# Patient Record
Sex: Male | Born: 1995 | Race: White | Hispanic: No | Marital: Single | Smoking: Never smoker
Health system: Southern US, Community
[De-identification: ages and names within clinical notes are randomized; demographics above are authoritative.]

## PROBLEM LIST (undated history)

## (undated) DIAGNOSIS — F419 Anxiety disorder, unspecified: Secondary | ICD-10-CM

## (undated) DIAGNOSIS — K219 Gastro-esophageal reflux disease without esophagitis: Secondary | ICD-10-CM

## (undated) DIAGNOSIS — F32A Depression, unspecified: Secondary | ICD-10-CM

## (undated) HISTORY — DX: Anxiety disorder, unspecified: F41.9

## (undated) HISTORY — PX: OTHER SURGICAL HISTORY: SHX169

## (undated) HISTORY — DX: Gastro-esophageal reflux disease without esophagitis: K21.9

## (undated) HISTORY — DX: Depression, unspecified: F32.A

---

## 2015-06-06 ENCOUNTER — Ambulatory Visit (INDEPENDENT_AMBULATORY_CARE_PROVIDER_SITE_OTHER): Payer: BLUE CROSS/BLUE SHIELD | Admitting: Gastroenterology

## 2015-06-06 ENCOUNTER — Other Ambulatory Visit: Payer: Self-pay

## 2015-06-06 ENCOUNTER — Encounter: Payer: Self-pay | Admitting: Gastroenterology

## 2015-06-06 VITALS — BP 122/61 | HR 68 | Temp 98.4°F | Ht 72.0 in | Wt 151.0 lb

## 2015-06-06 DIAGNOSIS — R12 Heartburn: Secondary | ICD-10-CM | POA: Diagnosis not present

## 2015-06-06 DIAGNOSIS — K30 Functional dyspepsia: Secondary | ICD-10-CM

## 2015-06-06 DIAGNOSIS — R1013 Epigastric pain: Secondary | ICD-10-CM

## 2015-06-06 DIAGNOSIS — K219 Gastro-esophageal reflux disease without esophagitis: Secondary | ICD-10-CM

## 2015-06-06 NOTE — Assessment & Plan Note (Signed)
This patient is 19 year old gentleman who comes in today with his parents for heartburn and abdominal pain and bloating. The patient reports that his burning sensation goes from his chest down to his lower abdomen. There is no report of any unexplained weight loss. The patient also has a history of reflux as a small child around the age of 19. The patient has not been feeling better with Prilosec or with vinegar that his mother said helped him when he was 10. The patient will be started on trial of Dexilant. If he has resolution of his symptoms he may try to go back on his omeprazole to take it at night before he goes to sleep since most of his symptoms are in the middle of the night. I do not think that the patient needs an upper endoscopy this time since he has no worry symptoms. The patient and his family have been explained the plan and agree with it.

## 2015-06-06 NOTE — Progress Notes (Signed)
Gastroenterology Consultation  Referring Provider:     No ref. provider found Primary Care Physician:  Cole PerchesMegan Johnson, DO Primary Gastroenterologist:  Dr. Servando SnareWohl     Reason for Consultation:     Abdominal bloating with heartburn        HPI:   Cole Riley is a 19 y.o. y/o male referred for consultation & management of  heartburn by Dr. Olevia PerchesMegan Johnson, DO.   The patient comes with his mother a symptoms of burning in his chest and into his a both at the top of his abdomen and the lower part of his abdomen. He reports that the symptoms are worse when he does not eat and better when he does the eat. The patient was on omeprazole in that he was still having problems with waking up at night with heartburn and nausea. The patient's mother reports that the patient had severe reflux when he was 19 years old. At that time she treated him with vinegar and she reports his symptoms went away. She tried this time but states that his symptoms did not get any better.The patient denies any unexplained weight loss. The patient denies any change in his bowel habits. He also denies any vomiting up of blood. There is no report of any black stools or bloody stools.  Past Medical History  Diagnosis Date  . GERD (gastroesophageal reflux disease)     Past Surgical History  Procedure Laterality Date  . Fenulectomy      Prior to Admission medications   Medication Sig Start Date End Date Taking? Authorizing Provider  omeprazole (PRILOSEC) 40 MG capsule Take 40 mg by mouth daily.  04/03/15   Megan Holly BodilyP Johnson, DO    Family History  Problem Relation Age of Onset  . Diabetes Maternal Grandmother   . Hypertension Maternal Grandmother   . Heart disease Paternal Grandmother   . Colon cancer Paternal Grandfather      History  Substance Use Topics  . Smoking status: Never Smoker   . Smokeless tobacco: Not on file  . Alcohol Use: No    Allergies as of 06/06/2015  . (No Known Allergies)    Review of Systems:     All systems reviewed and negative except where noted in HPI.   Physical Exam:  BP 122/61 mmHg  Pulse 68  Temp(Src) 98.4 F (36.9 C) (Oral)  Ht 6' (1.829 m)  Wt 151 lb (68.493 kg)  BMI 20.47 kg/m2 No LMP for male patient. Psych:  Alert and cooperative. Normal mood and affect. General:   Alert,  Well-developed, well-nourished, pleasant and cooperative in NAD Head:  Normocephalic and atraumatic. Eyes:  Sclera clear, no icterus.   Conjunctiva pink. Ears:  Normal auditory acuity. Nose:  No deformity, discharge, or lesions. Mouth:  No deformity or lesions,oropharynx pink & moist. Neck:  Supple; no masses or thyromegaly. Lungs:  Respirations even and unlabored.  Clear throughout to auscultation.   No wheezes, crackles, or rhonchi. No acute distress. Heart:  Regular rate and rhythm; no murmurs, clicks, rubs, or gallops. Abdomen:  Normal bowel sounds.  No bruits.  Soft, non-tender and non-distended without masses, hepatosplenomegaly or hernias noted.  No guarding or rebound tenderness.  Negative Carnett sign.   Rectal:  Deferred.  Msk:  Symmetrical without gross deformities.  Good, equal movement & strength bilaterally. Pulses:  Normal pulses noted. Extremities:  No clubbing or edema.  No cyanosis. Neurologic:  Alert and oriented x3;  grossly normal neurologically. Skin:  Intact  without significant lesions or rashes.  No jaundice. Lymph Nodes:  No significant cervical adenopathy. Psych:  Alert and cooperative. Normal mood and affect.  Imaging Studies: No results found.

## 2015-06-28 ENCOUNTER — Other Ambulatory Visit: Payer: Self-pay

## 2015-06-28 DIAGNOSIS — K219 Gastro-esophageal reflux disease without esophagitis: Secondary | ICD-10-CM

## 2015-06-28 MED ORDER — DEXLANSOPRAZOLE 60 MG PO CPDR: 60.0000 mg | DELAYED_RELEASE_CAPSULE | Freq: Every day | ORAL | Status: DC

## 2015-07-14 ENCOUNTER — Ambulatory Visit (INDEPENDENT_AMBULATORY_CARE_PROVIDER_SITE_OTHER): Payer: BLUE CROSS/BLUE SHIELD | Admitting: Family Medicine

## 2015-07-14 ENCOUNTER — Encounter: Payer: Self-pay | Admitting: Family Medicine

## 2015-07-14 VITALS — BP 125/74 | HR 72 | Temp 98.3°F | Ht 71.0 in | Wt 148.9 lb

## 2015-07-14 DIAGNOSIS — Z0000 Encounter for general adult medical examination without abnormal findings: Secondary | ICD-10-CM | POA: Diagnosis not present

## 2015-07-14 DIAGNOSIS — K219 Gastro-esophageal reflux disease without esophagitis: Secondary | ICD-10-CM | POA: Diagnosis not present

## 2015-07-14 DIAGNOSIS — Z23 Encounter for immunization: Secondary | ICD-10-CM | POA: Diagnosis not present

## 2015-07-14 LAB — LIPID PANEL PICCOLO, WAIVED
CHOL/HDL RATIO PICCOLO,WAIVE: 2.7 mg/dL
Cholesterol Piccolo, Waived: 120 mg/dL (ref <200)
HDL Chol Piccolo, Waived: 44 mg/dL — ABNORMAL LOW (ref >59)
LDL Chol Calc Piccolo Waived: 66 mg/dL (ref <100)
Triglycerides Piccolo,Waived: 54 mg/dL (ref <150)
VLDL Chol Calc Piccolo,Waive: 11 mg/dL (ref <30)

## 2015-07-14 LAB — CBC WITH DIFFERENTIAL/PLATELET
Hematocrit: 46 % (ref 37.5–51.0)
Hemoglobin: 16.1 g/dL (ref 12.6–17.7)
LYMPHS ABS: 2.2 10*3/uL (ref 0.7–3.1)
LYMPHS: 46 %
MCH: 30.8 pg (ref 26.6–33.0)
MCHC: 35 g/dL (ref 31.5–35.7)
MCV: 88 fL (ref 79–97)
MID (Absolute): 0.5 10*3/uL (ref 0.1–1.6)
MID: 10 %
Neutrophils Absolute: 2.2 10*3/uL (ref 1.4–7.0)
Neutrophils: 45 %
Platelets: 190 10*3/uL (ref 150–379)
RBC: 5.22 x10E6/uL (ref 4.14–5.80)
RDW: 13.1 % (ref 12.3–15.4)
WBC: 4.9 10*3/uL (ref 3.4–10.8)

## 2015-07-14 NOTE — Assessment & Plan Note (Signed)
Continue to follow with GI. Mom may want 2nd opinion. Will give referral without face to face if they decide to do that as we discussed it today.

## 2015-07-14 NOTE — Progress Notes (Signed)
BP 125/74 mmHg  Pulse 72  Temp(Src) 98.3 F (36.8 C)  Ht  (1.803 m)  Wt 148 lb 14.4 oz (67.541 kg)  BMI 20.78 kg/m2  SpO2 99%   Subjective:    Patient ID: Cole Riley, male    DOB: 05-18-1996, 19 y.o.   MRN: [id number]  HPI: Cole Riley is a 19 y.o. male presenting on 07/14/2015 for comprehensive medical examination. Current medical complaints include: Current concerns: None, belly is getting better with the dexilant. Getting better. May want 2nd opinion with different GI  Adolescent Assessment:  Confidentiality was discussed with the patient and if applicable, with caregiver as well.  Home and Environment:  Lives with: lives at home with mom and dad and brother Parental relations: Gets along well Friends/Peers: Good Nutrition/Eating Behaviors: Eats healthy Sports/Exercise:  Goes to the gym for an hour a day. Drinking almost a gallon a day  Education and Employment:  School Status: Starting college in August School History:  Work: not right now Activities: goes to [employer], has a girlfriend. Spends time with family  With parent out of the room and confidentiality discussed:   Patient reports being comfortable and safe at school and at home? Yes  Smoking: no Secondhand smoke exposure? no Drugs/EtOH: no  Sexuality: heterosexual Sexually active? no   Violence/Abuse: No Mood: Suicidality and Depression: No  Past Medical History:  Past Medical History  Diagnosis Date  . GERD (gastroesophageal reflux disease)     Surgical History:  Past Surgical History  Procedure Laterality Date  . Fenulectomy      Medications:  Current Outpatient Prescriptions on File Prior to Visit  Medication Sig  . dexlansoprazole (DEXILANT) 60 MG capsule Take 1 capsule (60 mg total) by mouth daily.   No current facility-administered medications on file prior to visit.    Allergies:  No Known Allergies  Social History:  History   Social History  . Marital Status:  Single    Spouse Name: N/A  . Number of Children: N/A  . Years of Education: N/A   Occupational History  . Not on file.   Social History Main Topics  . Smoking status: Never Smoker   . Smokeless tobacco: Never Used  . Alcohol Use: No  . Drug Use: No  . Sexual Activity: No   Other Topics Concern  . Not on file   Social History Narrative   History  Smoking status  . Never Smoker   Smokeless tobacco  . Never Used   History  Alcohol Use No    Family History:  Family History  Problem Relation Age of Onset  . Diabetes Maternal Grandmother   . Hypertension Maternal Grandmother   . Heart disease Paternal Grandmother   . Colon cancer Paternal Grandfather   . Cancer Paternal Grandfather     brain  . Sarcoidosis Father   . Hyperlipidemia Maternal Grandfather     Past medical history, surgical history, medications, allergies, family history and social history reviewed with patient today and changes made to appropriate areas of the chart.   Review of Systems  Constitutional: Negative.   HENT: Negative.   Eyes: Negative.   Respiratory: Negative.   Cardiovascular: Negative.   Gastrointestinal: Positive for heartburn, nausea and abdominal pain. Negative for vomiting, diarrhea, constipation, blood in stool and melena.  Genitourinary: Negative.   Musculoskeletal: Negative.   Skin: Negative.   Neurological: Negative.   Endo/Heme/Allergies: Negative.   Psychiatric/Behavioral: Negative.    All other ROS  negative except what is listed above and in the HPI.      Objective:    BP 125/74 mmHg  Pulse 72  Temp(Src) 98.3 F (36.8 C)  Ht 5\' 11"  (1.803 m)  Wt 148 lb 14.4 oz (67.541 kg)  BMI 20.78 kg/m2  SpO2 99%  Wt Readings from Last 3 Encounters:  07/14/15 148 lb 14.4 oz (67.541 kg) (47 %*, Z = -0.09)  06/06/15 151 lb (68.493 kg) (51 %*, Z = 0.02)  05/02/15 152 lb (68.947 kg) (53 %*, Z = 0.08)   * Growth percentiles are based on CDC 2-20 Years data.    Physical Exam   Constitutional: He is oriented to person, place, and time. He appears well-developed and well-nourished. No distress.  HENT:  Head: Normocephalic and atraumatic.  Right Ear: Hearing and external ear normal.  Left Ear: Hearing and external ear normal.  Nose: Nose normal.  Mouth/Throat: Oropharynx is clear and moist. No oropharyngeal exudate.  Eyes: Conjunctivae, EOM and lids are normal. Pupils are equal, round, and reactive to light. Right eye exhibits no discharge. Left eye exhibits no discharge. No scleral icterus.  Neck: Normal range of motion. Neck supple. No JVD present. No tracheal deviation present. No thyromegaly present.  Cardiovascular: Normal rate, regular rhythm, normal heart sounds and intact distal pulses.  Exam reveals no gallop and no friction rub.   No murmur heard. Pulmonary/Chest: Effort normal. No stridor. No respiratory distress. He has no wheezes. He has no rales. He exhibits no tenderness.  Abdominal: Soft. Bowel sounds are normal. He exhibits no distension and no mass. There is no tenderness. There is no rebound and no guarding. Hernia confirmed negative in the right inguinal area and confirmed negative in the left inguinal area.  Genitourinary: Testes normal and penis normal. Circumcised. No penile tenderness.  Musculoskeletal: Normal range of motion. He exhibits no edema or tenderness.  Lymphadenopathy:    He has no cervical adenopathy.  Neurological: He is alert and oriented to person, place, and time. He displays normal reflexes. No cranial nerve deficit. He exhibits normal muscle tone. Coordination normal.  Skin: Skin is warm, dry and intact. No rash noted. He is not diaphoretic. No erythema. No pallor.  Psychiatric: He has a normal mood and affect. His speech is normal and behavior is normal. Judgment and thought content normal. Cognition and memory are normal.  Nursing note and vitals reviewed.   No results found for this or any previous visit.    Assessment &  Plan:   Problem List Items Addressed This Visit      Digestive   GERD (gastroesophageal reflux disease)    Continue to follow with GI. Mom may want 2nd opinion. Will give referral without face to face if they decide to do that as we discussed it today.        Other Visit Diagnoses    Routine general medical examination at a health care facility    -  Primary    Healthy male. Will fill out form for school. Screening labs checked. Up to date on immunizations. Continue diet and exercise.     Relevant Orders    Comprehensive metabolic panel    CBC With Differential/Platelet    Lipid Panel Piccolo, Waived    TSH    Immunization due        Relevant Orders    Meningococcal polysaccharide vaccine subcutaneous (Completed)       LABORATORY TESTING:  Health maintenance labs ordered today as discussed  above.   IMMUNIZATIONS:   - Tdap: Tetanus vaccination status reviewed: last tetanus booster within 10 years. - Meningitis: 2nd vaccine given today  PATIENT COUNSELING:    Sexuality: Discussed sexually transmitted diseases, partner selection, use of condoms, avoidance of unintended pregnancy  and contraceptive alternatives.   Advised to avoid cigarette smoking.  I discussed with the patient that most people either abstain from alcohol or drink within safe limits (<=14/week and <=4 drinks/occasion for males, <=7/weeks and <= 3 drinks/occasion for females) and that the risk for alcohol disorders and other health effects rises proportionally with the number of drinks per week and how often a drinker exceeds daily limits.  Discussed cessation/primary prevention of drug use and availability of treatment for abuse.   Diet: Encouraged to adjust caloric intake to maintain  or achieve ideal body weight, to reduce intake of dietary saturated fat and total fat, to limit sodium intake by avoiding high sodium foods and not adding table salt, and to maintain adequate dietary potassium and calcium  preferably from fresh fruits, vegetables, and low-fat dairy products.    stressed the importance of regular exercise  Injury prevention: Discussed safety belts, safety helmets, smoke detector, smoking near bedding or upholstery.   Dental health: Discussed importance of regular tooth brushing, flossing, and dental visits.   Follow up plan: NEXT PREVENTATIVE PHYSICAL DUE IN 1 YEAR. Return in 1 year (on 07/13/2016).

## 2015-07-14 NOTE — Patient Instructions (Signed)

## 2015-07-15 LAB — COMPREHENSIVE METABOLIC PANEL
A/G RATIO: 2.8 — AB (ref 1.1–2.5)
ALBUMIN: 4.7 g/dL (ref 3.5–5.5)
ALT: 15 IU/L (ref 0–44)
AST: 18 IU/L (ref 0–40)
Alkaline Phosphatase: 80 IU/L (ref 56–127)
BUN/Creatinine Ratio: 13 (ref 8–19)
BUN: 14 mg/dL (ref 6–20)
Bilirubin Total: 1.1 mg/dL (ref 0.0–1.2)
CALCIUM: 9.9 mg/dL (ref 8.7–10.2)
CO2: 26 mmol/L (ref 18–29)
CREATININE: 1.1 mg/dL (ref 0.76–1.27)
Chloride: 99 mmol/L (ref 97–108)
GFR calc Af Amer: 113 mL/min/{1.73_m2} (ref >59)
GFR, EST NON AFRICAN AMERICAN: 97 mL/min/{1.73_m2} (ref >59)
GLOBULIN, TOTAL: 1.7 g/dL (ref 1.5–4.5)
GLUCOSE: 86 mg/dL (ref 65–99)
Potassium: 4.6 mmol/L (ref 3.5–5.2)
Sodium: 141 mmol/L (ref 134–144)
TOTAL PROTEIN: 6.4 g/dL (ref 6.0–8.5)

## 2015-07-17 ENCOUNTER — Encounter: Payer: Self-pay | Admitting: Family Medicine

## 2015-07-19 ENCOUNTER — Telehealth: Payer: Self-pay | Admitting: Family Medicine

## 2015-07-19 ENCOUNTER — Telehealth: Payer: Self-pay

## 2015-07-19 NOTE — Telephone Encounter (Signed)
Missing TSH lab. Checking with lab to see if it was drawn or if it is missing.

## 2015-07-19 NOTE — Telephone Encounter (Signed)
Called LVM for patient to let himn know that he may need to come in for blood work, I spoke with the lab and they were able to add it on.

## 2015-07-20 LAB — SPECIMEN STATUS REPORT

## 2015-07-20 LAB — TSH: TSH: 0.938 u[IU]/mL (ref 0.450–4.500)

## 2015-08-11 ENCOUNTER — Ambulatory Visit
Admission: EM | Admit: 2015-08-11 | Discharge: 2015-08-11 | Disposition: A | Discharge status: Home / Self Care | Payer: BLUE CROSS/BLUE SHIELD | Attending: Family Medicine | Admitting: Family Medicine

## 2015-08-11 ENCOUNTER — Encounter: Payer: Self-pay | Admitting: Emergency Medicine

## 2015-08-11 DIAGNOSIS — K219 Gastro-esophageal reflux disease without esophagitis: Secondary | ICD-10-CM | POA: Diagnosis not present

## 2015-08-11 DIAGNOSIS — Y93B9 Activity, other involving muscle strengthening exercises: Secondary | ICD-10-CM | POA: Insufficient documentation

## 2015-08-11 DIAGNOSIS — S39011A Strain of muscle, fascia and tendon of abdomen, initial encounter: Secondary | ICD-10-CM | POA: Diagnosis not present

## 2015-08-11 DIAGNOSIS — R109 Unspecified abdominal pain: Secondary | ICD-10-CM | POA: Diagnosis present

## 2015-08-11 LAB — URINALYSIS COMPLETE WITH MICROSCOPIC (ARMC ONLY)
BILIRUBIN URINE: NEGATIVE
Bacteria, UA: NONE SEEN — AB
Glucose, UA: NEGATIVE mg/dL
HGB URINE DIPSTICK: NEGATIVE
Ketones, ur: NEGATIVE mg/dL
LEUKOCYTES UA: NEGATIVE
Nitrite: NEGATIVE
PH: 5.5 (ref 5.0–8.0)
PROTEIN: NEGATIVE mg/dL
RBC / HPF: NONE SEEN RBC/hpf (ref <3)
SQUAMOUS EPITHELIAL / LPF: NONE SEEN — AB
Specific Gravity, Urine: 1.015 (ref 1.005–1.030)
WBC, UA: NONE SEEN WBC/hpf (ref <3)

## 2015-08-11 NOTE — ED Notes (Signed)
Abdominal pains, back pain and discomfort ,  bloating lower quadrant started today.

## 2015-08-11 NOTE — ED Provider Notes (Signed)
CSN: [id number]     Arrival date & time 08/11/15  1630 History   First MD Initiated Contact with Patient 08/11/15 1714     Chief Complaint  Patient presents with  . Abdominal Pain   (Consider location/radiation/quality/duration/timing/severity/associated sxs/prior Treatment) HPI Comments: 19 yo male with a 1 day h/o right lower abdomen, groin pain. Denies any fevers, chills, vomiting, diarrhea, nausea, melena, hematochezia, hematuria, dysuria, constipation, trauma. States has been working out recently and doing abdominal exercises. Has family h/o kidney stones and states is concerned about a possible kidney stone.   Patient is a 19 y.o. male presenting with abdominal pain. The history is provided by the patient.  Abdominal Pain   Past Medical History  Diagnosis Date  . GERD (gastroesophageal reflux disease)    Past Surgical History  Procedure Laterality Date  . Fenulectomy     Family History  Problem Relation Age of Onset  . Diabetes Maternal Grandmother   . Hypertension Maternal Grandmother   . Heart disease Paternal Grandmother   . Colon cancer Paternal Grandfather   . Cancer Paternal Grandfather     brain  . Sarcoidosis Father   . Hyperlipidemia Maternal Grandfather    Social History  Substance Use Topics  . Smoking status: Never Smoker   . Smokeless tobacco: Never Used  . Alcohol Use: No    Review of Systems  Gastrointestinal: Positive for abdominal pain.    Allergies  Review of patient's allergies indicates no known allergies.  Home Medications   Prior to Admission medications   Medication Sig Start Date End Date Taking? Authorizing Provider  dexlansoprazole (DEXILANT) 60 MG capsule Take 1 capsule (60 mg total) by mouth daily. 06/28/15   Midge Minium, MD   BP 96/57 mmHg  Pulse 62  Temp(Src) 97.9 F (36.6 C) (Oral)  Resp 18  Ht 5\' 11"  (1.803 m)  Wt 148 lb (67.132 kg)  BMI 20.65 kg/m2  SpO2 100% Physical Exam  Constitutional: He appears well-developed  and well-nourished. No distress.  HENT:  Right Ear: Tympanic membrane and ear canal normal.  Left Ear: Tympanic membrane and ear canal normal.  Nose: Nose normal.  Mouth/Throat: Uvula is midline and mucous membranes are normal. No tonsillar abscesses.  Eyes: EOM are normal.  Neck: Normal range of motion.  Cardiovascular: Normal rate.   Pulmonary/Chest: Effort normal. No respiratory distress.  Abdominal: Soft. Bowel sounds are normal. He exhibits no distension and no mass. There is no tenderness. There is no rebound and no guarding. Hernia confirmed negative in the right inguinal area and confirmed negative in the left inguinal area.  Genitourinary: Testes normal and penis normal.    Cremasteric reflex is present. Right testis shows no mass, no swelling and no tenderness. Left testis shows no swelling and no tenderness.  Mild groin tenderness  Lymphadenopathy:       Right: No inguinal adenopathy present.       Left: No inguinal adenopathy present.  Neurological: He is alert.  Skin: Skin is warm and dry. No rash noted. He is not diaphoretic.  Nursing note and vitals reviewed.   ED Course  Procedures (including critical care time) Labs Review Labs Reviewed  URINALYSIS COMPLETEWITH MICROSCOPIC ([facility] ONLY) - Abnormal; Notable for the following:    Bacteria, UA NONE SEEN (*)    Squamous Epithelial / LPF NONE SEEN (*)    All other components within normal limits    Imaging Review No results found.   MDM   1. Abdominal muscle  strain, initial encounter    Plan: 1. Test results and diagnosis reviewed with patient 2. Recommend supportive treatment with otc NSAIDS, ice, rest 3. F/u prn if symptoms worsen or don't improve    Payton Mccallum, MD 08/11/15 1929

## 2016-12-16 DIAGNOSIS — H5213 Myopia, bilateral: Secondary | ICD-10-CM | POA: Diagnosis not present

## 2017-07-01 ENCOUNTER — Ambulatory Visit (INDEPENDENT_AMBULATORY_CARE_PROVIDER_SITE_OTHER)
Admit: 2017-07-01 | Discharge: 2017-07-01 | Disposition: A | Discharge status: Home / Self Care | Payer: BLUE CROSS/BLUE SHIELD

## 2017-07-01 DIAGNOSIS — R1031 Right lower quadrant pain: Secondary | ICD-10-CM | POA: Diagnosis not present

## 2017-07-02 ENCOUNTER — Ambulatory Visit
Admission: EM | Admit: 2017-07-02 | Discharge: 2017-07-02 | Disposition: A | Discharge status: Home / Self Care | Payer: BLUE CROSS/BLUE SHIELD | Attending: Family Medicine | Admitting: Family Medicine

## 2017-07-02 DIAGNOSIS — R110 Nausea: Secondary | ICD-10-CM

## 2017-07-02 DIAGNOSIS — R109 Unspecified abdominal pain: Secondary | ICD-10-CM | POA: Diagnosis not present

## 2017-07-02 DIAGNOSIS — R1031 Right lower quadrant pain: Secondary | ICD-10-CM | POA: Diagnosis not present

## 2017-07-02 DIAGNOSIS — F909 Attention-deficit hyperactivity disorder, unspecified type: Secondary | ICD-10-CM | POA: Diagnosis not present

## 2017-07-02 DIAGNOSIS — K219 Gastro-esophageal reflux disease without esophagitis: Secondary | ICD-10-CM | POA: Insufficient documentation

## 2017-07-02 LAB — URINALYSIS, COMPLETE (UACMP) WITH MICROSCOPIC
Bacteria, UA: NONE SEEN
Bilirubin Urine: NEGATIVE
Glucose, UA: NEGATIVE mg/dL
Hgb urine dipstick: NEGATIVE
Leukocytes, UA: NEGATIVE
Nitrite: NEGATIVE
PROTEIN: NEGATIVE mg/dL
RBC / HPF: NONE SEEN RBC/hpf (ref 0–5)
SPECIFIC GRAVITY, URINE: 1.015 (ref 1.005–1.030)
Squamous Epithelial / LPF: NONE SEEN
pH: >9 — ABNORMAL HIGH (ref 5.0–8.0)

## 2017-07-02 LAB — COMPREHENSIVE METABOLIC PANEL
ALBUMIN: 5.4 g/dL — AB (ref 3.5–5.0)
ALK PHOS: 74 U/L (ref 38–126)
ALT: 24 U/L (ref 17–63)
AST: 23 U/L (ref 15–41)
Anion gap: 9 (ref 5–15)
BILIRUBIN TOTAL: 1.4 mg/dL — AB (ref 0.3–1.2)
BUN: 11 mg/dL (ref 6–20)
CALCIUM: 9.8 mg/dL (ref 8.9–10.3)
CO2: 26 mmol/L (ref 22–32)
Chloride: 105 mmol/L (ref 101–111)
Creatinine, Ser: 1.03 mg/dL (ref 0.61–1.24)
GFR calc Af Amer: >60 mL/min (ref >60)
GFR calc non Af Amer: >60 mL/min (ref >60)
GLUCOSE: 101 mg/dL — AB (ref 65–99)
Potassium: 3.9 mmol/L (ref 3.5–5.1)
Sodium: 140 mmol/L (ref 135–145)
TOTAL PROTEIN: 8 g/dL (ref 6.5–8.1)

## 2017-07-02 LAB — CBC WITH DIFFERENTIAL/PLATELET
BASOS ABS: 0 10*3/uL (ref 0–0.1)
BASOS PCT: 0 %
Eosinophils Absolute: 0 10*3/uL (ref 0–0.7)
Eosinophils Relative: 0 %
HEMATOCRIT: 48.3 % (ref 40.0–52.0)
HEMOGLOBIN: 16.7 g/dL (ref 13.0–18.0)
Lymphocytes Relative: 22 %
Lymphs Abs: 1.7 10*3/uL (ref 1.0–3.6)
MCH: 30.2 pg (ref 26.0–34.0)
MCHC: 34.5 g/dL (ref 32.0–36.0)
MCV: 87.3 fL (ref 80.0–100.0)
MONOS PCT: 7 %
Monocytes Absolute: 0.5 10*3/uL (ref 0.2–1.0)
NEUTROS ABS: 5.4 10*3/uL (ref 1.4–6.5)
Neutrophils Relative %: 71 %
Platelets: 231 10*3/uL (ref 150–440)
RBC: 5.53 MIL/uL (ref 4.40–5.90)
RDW: 12.8 % (ref 11.5–14.5)
WBC: 7.6 10*3/uL (ref 3.8–10.6)

## 2017-07-02 LAB — AMYLASE: Amylase: 71 U/L (ref 28–100)

## 2017-07-02 LAB — LIPASE, BLOOD: Lipase: 29 U/L (ref 11–51)

## 2017-07-02 MED ORDER — ONDANSETRON 8 MG PO TBDP: 8.0000 mg | ORAL_TABLET | Freq: Three times a day (TID) | ORAL | 0 refills | Status: DC | PRN

## 2017-07-02 MED ORDER — IOPAMIDOL (ISOVUE-300) INJECTION 61%
100.0000 mL | Freq: Once | INTRAVENOUS | Status: AC | PRN
  Administered 2017-07-02: 100 mL via INTRAVENOUS

## 2017-07-02 MED ORDER — ONDANSETRON 8 MG PO TBDP
8.0000 mg | ORAL_TABLET | Freq: Once | ORAL | Status: AC
  Administered 2017-07-02: 8 mg via ORAL

## 2017-07-02 NOTE — Discharge Instructions (Signed)
Abdominal pain gets worse when going to the ED of his choice

## 2017-07-02 NOTE — ED Provider Notes (Signed)
[facility]    CSN: [id number] Arrival date & time: 07/02/17  1140     History   Chief Complaint Chief Complaint  Patient presents with  . Abdominal Pain    right lower quad    HPI Cole Riley is a 21 y.o. male.   Patient is a 21 year old white male with a history of ADHD. He reports having abdominal discomfort last night variably ED pain is eccentric 52 hands full night's and out's of 2 tolerating stop. He states that he has a history of reflux but this pain and discomfort was different from his reflux pain. He started having more pain this morning around 3:00 this morning. He is to go to [place] his girlfriend but now canceled the trip. He is continued to have pain in his right lower quadrant abdomen and has now localized to right quadrant. Along with abdominal pain he's had nausea no vomiting though. He also reports no appetite this morning states tried and was unable to really eat anything of substance. He's continued to have pain pain is Sharper in his right lower quadrant.   The history is provided by the patient and a parent. The history is limited by the absence of a caregiver. No language interpreter was used.  Abdominal Pain  Pain location:  RLQ Pain quality: cramping, sharp and shooting   Pain radiates to:  Does not radiate Pain severity:  Moderate Onset quality:  Sudden Timing:  Constant Progression:  Worsening Chronicity:  New Context: not medication withdrawal and not recent sexual activity   Relieved by:  Nothing Associated symptoms: nausea     Past Medical History:  Diagnosis Date  . GERD (gastroesophageal reflux disease)     Patient Active Problem List   Diagnosis Date Noted  . GERD (gastroesophageal reflux disease) 06/06/2015    Past Surgical History:  Procedure Laterality Date  . fenulectomy         Home Medications    Prior to Admission medications   Medication Sig Start Date End Date Taking? Authorizing Provider    dexlansoprazole (DEXILANT) 60 MG capsule Take 1 capsule (60 mg total) by mouth daily. 06/28/15   Midge Minium, MD  ondansetron (ZOFRAN ODT) 8 MG disintegrating tablet Take 1 tablet (8 mg total) by mouth every 8 (eight) hours as needed for nausea or vomiting. 07/02/17   Hassan Rowan, MD    Family History Family History  Problem Relation Age of Onset  . Sarcoidosis Father   . Diabetes Maternal Grandmother   . Hypertension Maternal Grandmother   . Heart disease Paternal Grandmother   . Colon cancer Paternal Grandfather   . Cancer Paternal Grandfather        brain  . Hyperlipidemia Maternal Grandfather     Social History Social History  Substance Use Topics  . Smoking status: Never Smoker  . Smokeless tobacco: Never Used  . Alcohol use No     Allergies   Patient has no known allergies.   Review of Systems Review of Systems  Gastrointestinal: Positive for abdominal pain and nausea.  All other systems reviewed and are negative.    Physical Exam Triage Vital Signs ED Triage Vitals  Enc Vitals Group     BP 07/02/17 1207 (!) 144/75     Pulse Rate 07/02/17 1207 76     Resp 07/02/17 1207 18     Temp 07/02/17 1207 98.1 F (36.7 C)     Temp Source 07/02/17 1207 Oral  SpO2 07/02/17 1207 100 %     Weight 07/02/17 1209 168 lb (76.2 kg)     Height 07/02/17 1209 6' (1.829 m)     Head Circumference --      Peak Flow --      Pain Score 07/02/17 1209 7     Pain Loc --      Pain Edu? --      Excl. in GC? --    No data found.   Updated Vital Signs BP (!) 144/75 (BP Location: Left Arm)   Pulse 76   Temp 98.1 F (36.7 C) (Oral)   Resp 18   Ht 6' (1.829 m)   Wt 168 lb (76.2 kg)   SpO2 100%   BMI 22.78 kg/m   Visual Acuity Right Eye Distance:   Left Eye Distance:   Bilateral Distance:    Right Eye Near:   Left Eye Near:    Bilateral Near:     Physical Exam  Constitutional: He is oriented to person, place, and time. He appears well-developed and well-nourished.  No distress.  HENT:  Head: Normocephalic.  Right Ear: External ear normal.  Left Ear: External ear normal.  Mouth/Throat: Oropharynx is clear and moist.  Eyes: EOM are normal. Pupils are equal, round, and reactive to light.  Neck: Normal range of motion. Neck supple. No thyromegaly present.  Cardiovascular: Normal rate, regular rhythm and normal heart sounds.   Pulmonary/Chest: Effort normal and breath sounds normal.  Abdominal: Soft. There is no hepatosplenomegaly. There is tenderness in the right lower quadrant. There is no CVA tenderness. No hernia.  Genitourinary: Rectum normal and prostate normal. Rectal exam shows no internal hemorrhoid, no fissure and anal tone normal.  Genitourinary Comments: On rectal examination had right lower quadrant tenderness as well  Musculoskeletal: Normal range of motion.  Neurological: He is alert and oriented to person, place, and time.  Skin: Skin is warm. He is not diaphoretic.  Psychiatric: He has a normal mood and affect.  Vitals reviewed.    UC Treatments / Results  Labs (all labs ordered are listed, but only abnormal results are displayed) Labs Reviewed  COMPREHENSIVE METABOLIC PANEL - Abnormal; Notable for the following:       Result Value   Glucose, Bld 101 (*)    Albumin 5.4 (*)    Total Bilirubin 1.4 (*)    All other components within normal limits  URINALYSIS, COMPLETE (UACMP) WITH MICROSCOPIC - Abnormal; Notable for the following:    Color, Urine STRAW (*)    pH >9.0 (*)    Ketones, ur TRACE (*)    All other components within normal limits  URINE CULTURE  CBC WITH DIFFERENTIAL/PLATELET  LIPASE, BLOOD  AMYLASE    EKG  EKG Interpretation None       Radiology Ct Abdomen Pelvis W Contrast  Result Date: 07/02/2017 CLINICAL DATA:  Patient with right upper quadrant abdominal pain. Nausea. EXAM: CT ABDOMEN AND PELVIS WITH CONTRAST TECHNIQUE: Multidetector CT imaging of the abdomen and pelvis was performed using the standard  protocol following bolus administration of intravenous contrast. CONTRAST:  100mL ISOVUE-300 IOPAMIDOL (ISOVUE-300) INJECTION 61% COMPARISON:  None. FINDINGS: Lower chest: Normal heart size. Lung bases are clear. No pleural effusion. Hepatobiliary: The liver is normal in size and contour. No focal hepatic lesion is identified. Gallbladder is unremarkable. Pancreas: Unremarkable Spleen: Unremarkable Adrenals/Urinary Tract: Normal adrenal glands. Kidneys enhance symmetrically with contrast. No hydronephrosis. Urinary bladder is unremarkable. Stomach/Bowel: No abnormal bowel wall thickening  or evidence for bowel obstruction. No free fluid or free intraperitoneal air. Normal appendix. Normal morphology of the stomach. Vascular/Lymphatic: Normal caliber abdominal aorta. No retroperitoneal lymphadenopathy. Reproductive: Prostate unremarkable. Other: None. Musculoskeletal: No aggressive or acute appearing osseous lesions. IMPRESSION: No acute process within the abdomen or pelvis. Electronically Signed   By: Annia Belt M.D.   On: 07/02/2017 14:28    Procedures Procedures (including critical care time)  Medications Ordered in UC Medications  ondansetron (ZOFRAN-ODT) disintegrating tablet 8 mg (8 mg Oral Given 07/02/17 1318)   Results for orders placed or performed during the hospital encounter of 07/02/17  CBC with Differential  Result Value Ref Range   WBC 7.6 3.8 - 10.6 K/uL   RBC 5.53 4.40 - 5.90 MIL/uL   Hemoglobin 16.7 13.0 - 18.0 g/dL   HCT 16.1 09.6 - 04.5 %   MCV 87.3 80.0 - 100.0 fL   MCH 30.2 26.0 - 34.0 pg   MCHC 34.5 32.0 - 36.0 g/dL   RDW 40.9 81.1 - 91.4 %   Platelets 231 150 - 440 K/uL   Neutrophils Relative % 71 %   Neutro Abs 5.4 1.4 - 6.5 K/uL   Lymphocytes Relative 22 %   Lymphs Abs 1.7 1.0 - 3.6 K/uL   Monocytes Relative 7 %   Monocytes Absolute 0.5 0.2 - 1.0 K/uL   Eosinophils Relative 0 %   Eosinophils Absolute 0.0 0 - 0.7 K/uL   Basophils Relative 0 %   Basophils Absolute  0.0 0 - 0.1 K/uL  Comprehensive metabolic panel  Result Value Ref Range   Sodium 140 135 - 145 mmol/L   Potassium 3.9 3.5 - 5.1 mmol/L   Chloride 105 101 - 111 mmol/L   CO2 26 22 - 32 mmol/L   Glucose, Bld 101 (H) 65 - 99 mg/dL   BUN 11 6 - 20 mg/dL   Creatinine, Ser 7.82 0.61 - 1.24 mg/dL   Calcium 9.8 8.9 - 95.6 mg/dL   Total Protein 8.0 6.5 - 8.1 g/dL   Albumin 5.4 (H) 3.5 - 5.0 g/dL   AST 23 15 - 41 U/L   ALT 24 17 - 63 U/L   Alkaline Phosphatase 74 38 - 126 U/L   Total Bilirubin 1.4 (H) 0.3 - 1.2 mg/dL   GFR calc non Af Amer >60 >60 mL/min   GFR calc Af Amer >60 >60 mL/min   Anion gap 9 5 - 15  Lipase, blood  Result Value Ref Range   Lipase 29 11 - 51 U/L  Amylase  Result Value Ref Range   Amylase 71 28 - 100 U/L  Urinalysis, Complete w Microscopic  Result Value Ref Range   Color, Urine STRAW (A) YELLOW   APPearance CLEAR CLEAR   Specific Gravity, Urine 1.015 1.005 - 1.030   pH >9.0 (H) 5.0 - 8.0   Glucose, UA NEGATIVE NEGATIVE mg/dL   Hgb urine dipstick NEGATIVE NEGATIVE   Bilirubin Urine NEGATIVE NEGATIVE   Ketones, ur TRACE (A) NEGATIVE mg/dL   Protein, ur NEGATIVE NEGATIVE mg/dL   Nitrite NEGATIVE NEGATIVE   Leukocytes, UA NEGATIVE NEGATIVE   Squamous Epithelial / LPF NONE SEEN NONE SEEN   WBC, UA 0-5 0 - 5 WBC/hpf   RBC / HPF NONE SEEN 0 - 5 RBC/hpf   Bacteria, UA NONE SEEN NONE SEEN    Initial Impression / Assessment and Plan / UC Course  I have reviewed the triage vital signs and the nursing notes.  Pertinent labs &  imaging results that were available during my care of the patient were reviewed by me and considered in my medical decision making (see chart for details).   patient was informed lab works are negative CT of the abdomen was negative we can work note for today and tomorrow Zofran for nausea diagnosis as a gastroenteritis follow-up with his PCP as needed    Final Clinical Impressions(s) / UC Diagnoses   Final diagnoses:  Right lower  quadrant abdominal pain    New Prescriptions New Prescriptions   ONDANSETRON (ZOFRAN ODT) 8 MG DISINTEGRATING TABLET    Take 1 tablet (8 mg total) by mouth every 8 (eight) hours as needed for nausea or vomiting.    Note: This dictation was prepared with Dragon dictation along with smaller phrase technology. Any transcriptional errors that result from this process are unintentional.   Hassan Rowan, MD 07/02/17 1459

## 2017-07-02 NOTE — ED Triage Notes (Signed)
Pt c/o RLQ pain that is stabbing. He has had nausea and fuzzy feeling in his head. He took tylenol and it isnt helping.

## 2017-07-03 ENCOUNTER — Inpatient Hospital Stay: Admit: 2017-07-03 | Payer: BLUE CROSS/BLUE SHIELD

## 2017-07-04 LAB — URINE CULTURE: Culture: <10000 — AB

## 2017-07-21 ENCOUNTER — Ambulatory Visit (INDEPENDENT_AMBULATORY_CARE_PROVIDER_SITE_OTHER): Payer: BLUE CROSS/BLUE SHIELD | Admitting: Family Medicine

## 2017-07-21 ENCOUNTER — Encounter: Payer: Self-pay | Admitting: Family Medicine

## 2017-07-21 VITALS — BP 122/73 | HR 76 | Temp 99.1°F | Wt 167.0 lb

## 2017-07-21 DIAGNOSIS — G43009 Migraine without aura, not intractable, without status migrainosus: Secondary | ICD-10-CM

## 2017-07-21 MED ORDER — KETOROLAC TROMETHAMINE 60 MG/2ML IM SOLN
60.0000 mg | Freq: Once | INTRAMUSCULAR | Status: AC
  Administered 2017-07-21: 60 mg via INTRAMUSCULAR

## 2017-07-21 MED ORDER — SUMATRIPTAN SUCCINATE 6 MG/0.5ML ~~LOC~~ SOLN
6.0000 mg | Freq: Once | SUBCUTANEOUS | Status: AC
  Administered 2017-07-21: 6 mg via SUBCUTANEOUS

## 2017-07-21 NOTE — Patient Instructions (Signed)
Follow up if no improvement 

## 2017-07-21 NOTE — Progress Notes (Signed)
   BP 122/73   Pulse 76   Temp 99.1 F (37.3 C)   Wt 167 lb (75.8 kg)   SpO2 98%   BMI 22.65 kg/m    Subjective:    Patient ID: Cole Riley Read, male    DOB: May 12, 1996, 20 y.o.   MRN: [id number]  HPI: Cole Riley Breach is a 21 y.o. male  Chief Complaint  Patient presents with  . Headache    increasing over the last month but worse since Saturday. Headaches at base of his skull and sometimes moves to his temples. head fog, nausea, stiff neck.   Patient presents with mild, intermittent HAs over the past month. HA became more persistent and severe 2 days ago, localized to back and top of head and associated with phonophobia, "brain fog", and nausea. Started taking tylenol Saturday - 600 mg per day and some zofran which seemed to help. No hx of migraines or HAs of other kinds. Denies confusion, speech or thought difficulties, weakness of extremities.   Relevant past medical, surgical, family and social history reviewed and updated as indicated. Interim medical history since our last visit reviewed. Allergies and medications reviewed and updated.  Review of Systems  Constitutional: Negative.   HENT: Negative.   Respiratory: Negative.   Gastrointestinal: Positive for nausea.  Genitourinary: Negative.   Musculoskeletal: Negative.   Neurological: Positive for headaches.  Psychiatric/Behavioral: Negative.    Per HPI unless specifically indicated above     Objective:    BP 122/73   Pulse 76   Temp 99.1 F (37.3 C)   Wt 167 lb (75.8 kg)   SpO2 98%   BMI 22.65 kg/m   Wt Readings from Last 3 Encounters:  07/21/17 167 lb (75.8 kg)  07/02/17 168 lb (76.2 kg)  08/11/15 148 lb (67.1 kg) (45 %, Z= -0.14)*   * Growth percentiles are based on CDC 2-20 Years data.    Physical Exam  Constitutional: He is oriented to person, place, and time. He appears well-developed and well-nourished. No distress.  HENT:  Head: Atraumatic.  Eyes: Pupils are equal, round, and reactive to light.  Conjunctivae are normal.  Neck: Normal range of motion. Neck supple.  Cardiovascular: Normal rate and normal heart sounds.   Pulmonary/Chest: Effort normal and breath sounds normal. No respiratory distress.  Musculoskeletal: Normal range of motion.  Lymphadenopathy:    He has no cervical adenopathy.  Neurological: He is alert and oriented to person, place, and time. No cranial nerve deficit. Coordination normal.  Skin: Skin is warm and dry.  Psychiatric: He has a normal mood and affect. His behavior is normal.  Nursing note and vitals reviewed.     Assessment & Plan:   Problem List Items Addressed This Visit      Cardiovascular and Mediastinum   Migraine without aura and without status migrainosus, not intractable - Primary    IM toradol and imitrex given today. Continue tylenol prn. Stay well hydrated, rest. F/u via mychart with how medications are working for sxs. Return precautions reviewed      Relevant Medications   ketorolac (TORADOL) injection 60 mg (Completed)   SUMAtriptan (IMITREX) injection 6 mg (Completed)       Follow up plan: Return if symptoms worsen or fail to improve.

## 2017-07-21 NOTE — Assessment & Plan Note (Signed)
IM toradol and imitrex given today. Continue tylenol prn. Stay well hydrated, rest. F/u via mychart with how medications are working for sxs. Return precautions reviewed

## 2018-04-22 IMAGING — CT CT ABD-PELV W/ CM
2 of 4 series · 17 of 46 positions shown, 19 images · IV contrast (iopamidol)
Comparison: None.

CLINICAL DATA: Patient with right upper quadrant abdominal pain.
Nausea.

EXAM:
CT ABDOMEN AND PELVIS WITH CONTRAST
TECHNIQUE: Multidetector CT imaging of the abdomen and pelvis was performed
using the standard protocol following bolus administration of
intravenous contrast.
CONTRAST:  100mL 1A5H1D-R88 IOPAMIDOL (1A5H1D-R88) INJECTION 61%

[Series 2: axial soft tissue · axial · 0.68mm/px · z∈[-988,-553]mm · 14 of 95 slices shown, 16 images]
[im 4/95  soft-tissue]
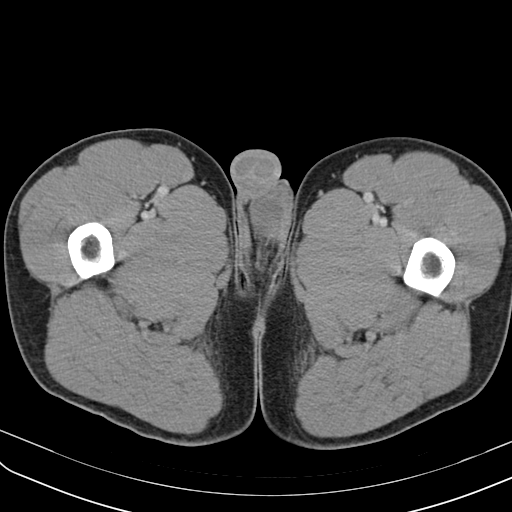
[im 4/95  bone]
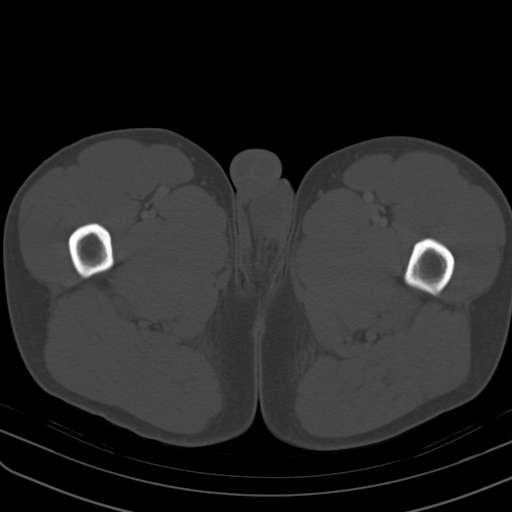
[im 12/95  soft-tissue]
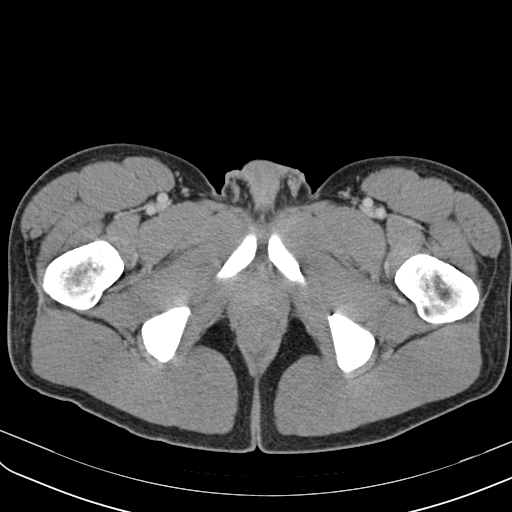
[im 20/95  soft-tissue]
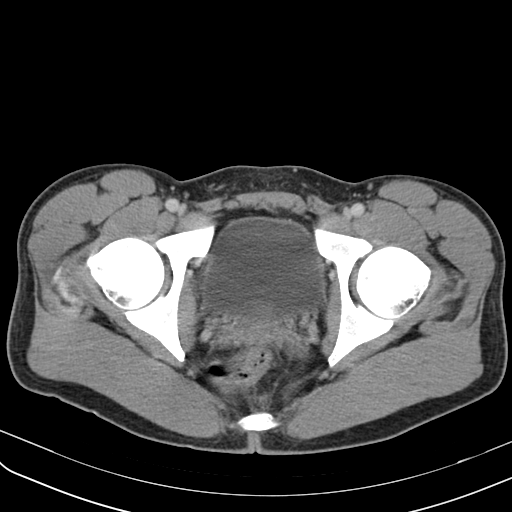
[im 24/95  soft-tissue]
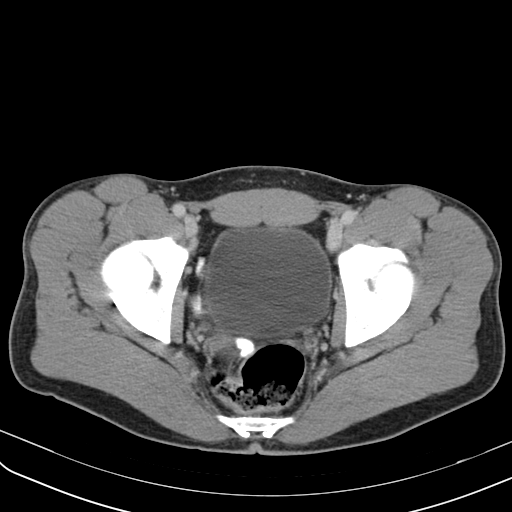
[im 32/95  soft-tissue]
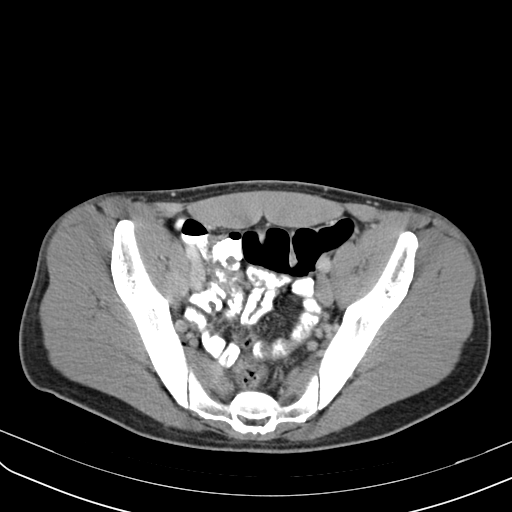
[im 40/95  soft-tissue]
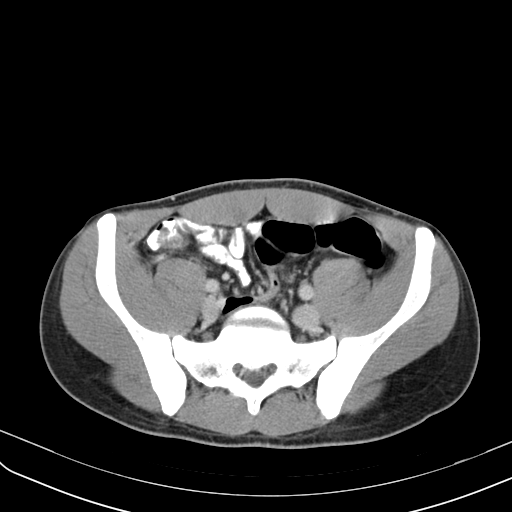
[im 44/95  soft-tissue]
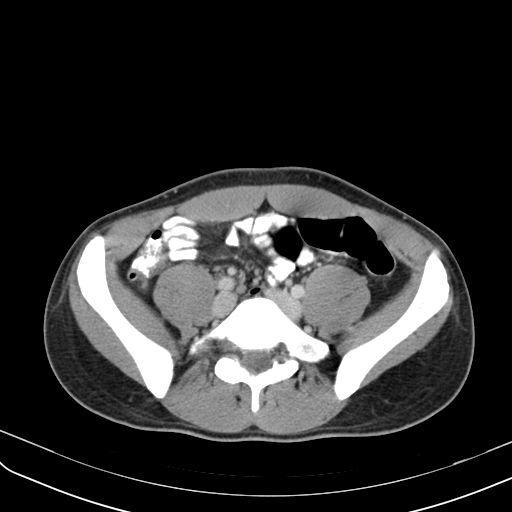
[im 51/95  soft-tissue]
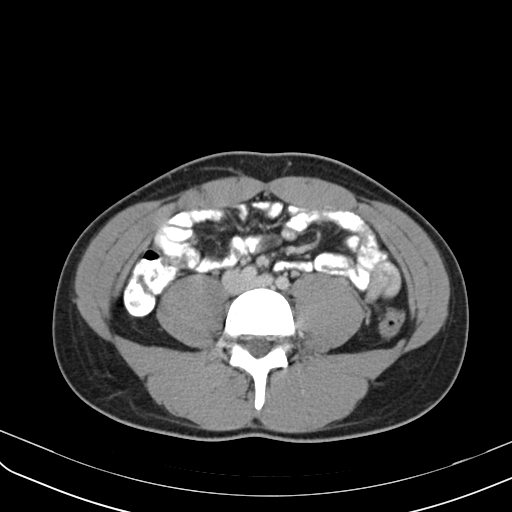
[im 55/95  soft-tissue]
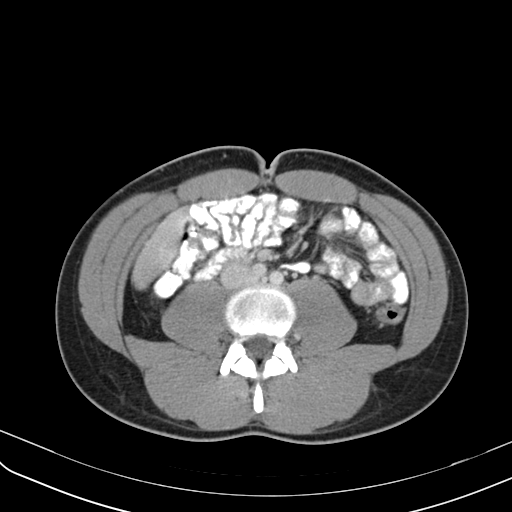
[im 55/95  bone]
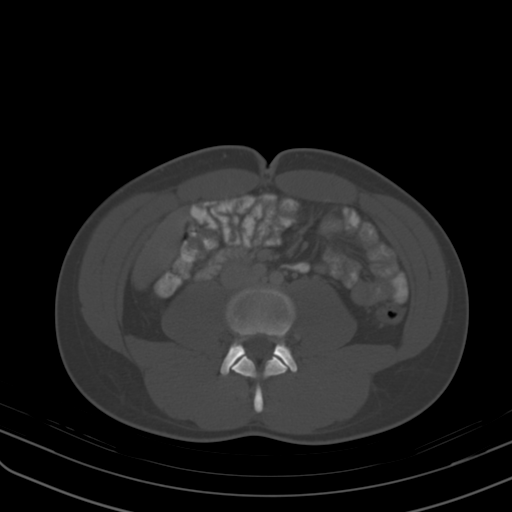
[im 63/95  soft-tissue]
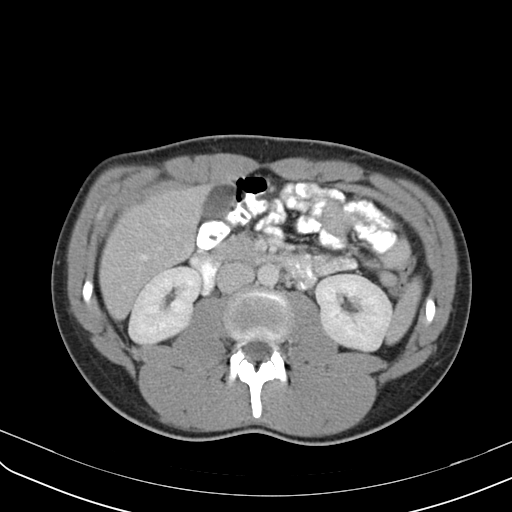
[im 71/95  soft-tissue]
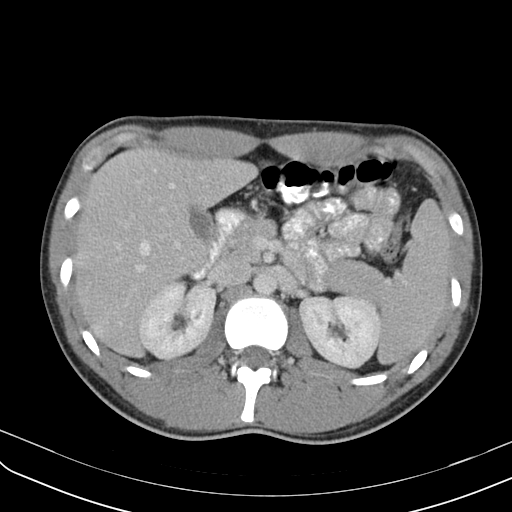
[im 75/95  soft-tissue]
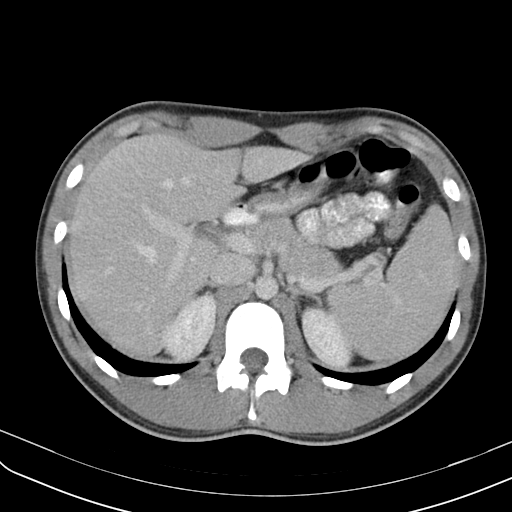
[im 83/95  soft-tissue]
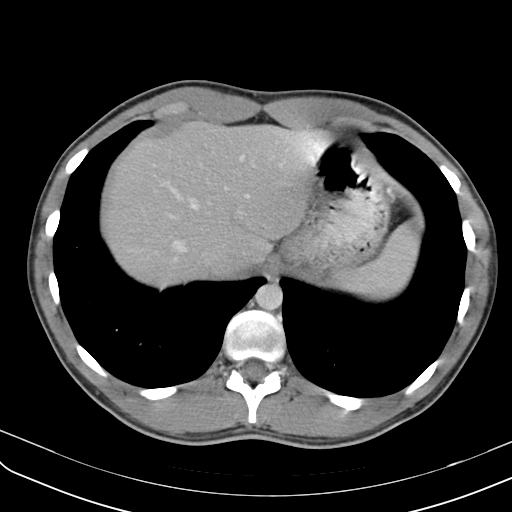
[im 91/95  soft-tissue]
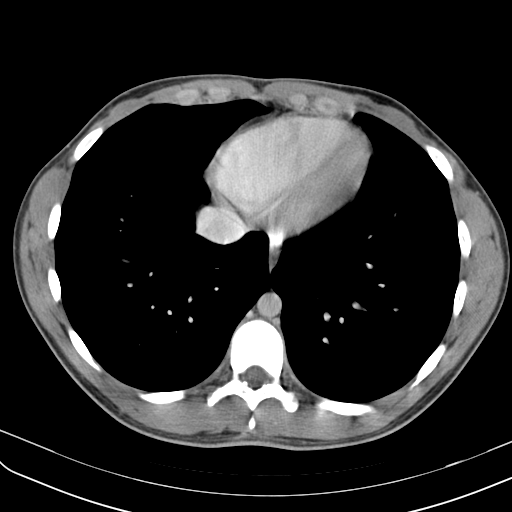

[Series 602: coronal · coronal · 0.92mm/px · 3 of 110 slices shown]
[im 37/110  soft-tissue]
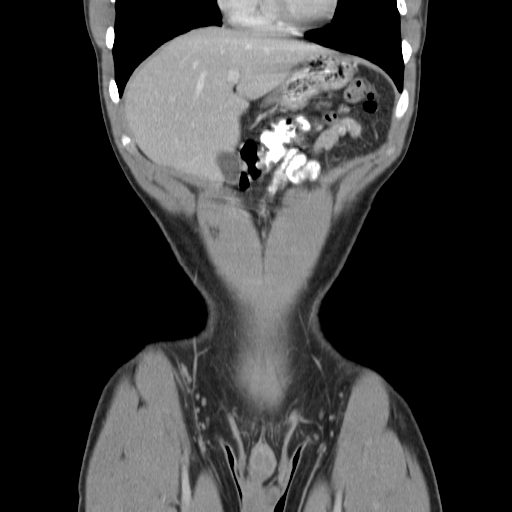
[im 49/110  soft-tissue]
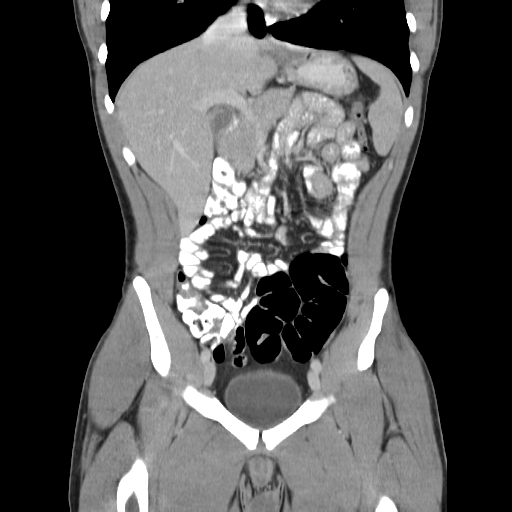
[im 61/110  soft-tissue]
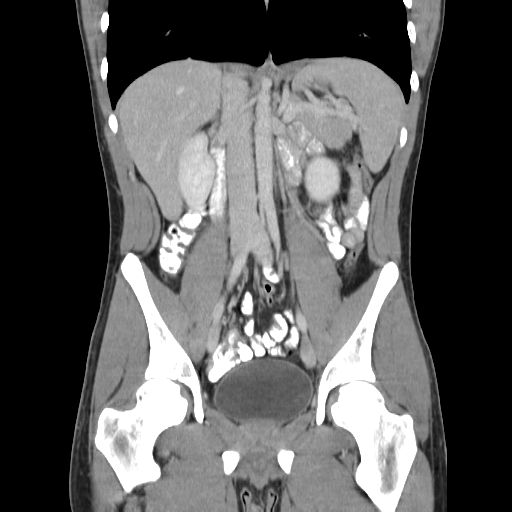

[17 of 46 positions shown; findings below may reference images not displayed]

FINDINGS: Lower chest: Normal heart size. Lung bases are clear. No pleural
effusion.

Hepatobiliary: The liver is normal in size and contour. No focal
hepatic lesion is identified. Gallbladder is unremarkable.

Pancreas: Unremarkable

Spleen: Unremarkable

Adrenals/Urinary Tract: Normal adrenal glands. Kidneys enhance
symmetrically with contrast. No hydronephrosis. Urinary bladder is
unremarkable.

Stomach/Bowel: No abnormal bowel wall thickening or evidence for
bowel obstruction. No free fluid or free intraperitoneal air. Normal
appendix. Normal morphology of the stomach.

Vascular/Lymphatic: Normal caliber abdominal aorta. No
retroperitoneal lymphadenopathy.

Reproductive: Prostate unremarkable.

Other: None.

Musculoskeletal: No aggressive or acute appearing osseous lesions.
IMPRESSION: No acute process within the abdomen or pelvis.

## 2018-05-13 DIAGNOSIS — H5213 Myopia, bilateral: Secondary | ICD-10-CM | POA: Diagnosis not present

## 2019-08-01 DIAGNOSIS — Z20828 Contact with and (suspected) exposure to other viral communicable diseases: Secondary | ICD-10-CM | POA: Diagnosis not present

## 2021-02-27 ENCOUNTER — Encounter: Payer: BLUE CROSS/BLUE SHIELD | Admitting: Family Medicine

## 2021-03-05 ENCOUNTER — Other Ambulatory Visit: Payer: Self-pay

## 2021-03-05 ENCOUNTER — Ambulatory Visit: Payer: 59 | Admitting: Nurse Practitioner

## 2021-03-05 ENCOUNTER — Encounter: Payer: Self-pay | Admitting: Nurse Practitioner

## 2021-03-05 ENCOUNTER — Ambulatory Visit: Payer: Self-pay | Admitting: Nurse Practitioner

## 2021-03-05 VITALS — BP 129/71 | HR 67 | Temp 98.0°F | Ht 71.5 in | Wt 171.2 lb

## 2021-03-05 DIAGNOSIS — Z7689 Persons encountering health services in other specified circumstances: Secondary | ICD-10-CM

## 2021-03-05 DIAGNOSIS — F320 Major depressive disorder, single episode, mild: Secondary | ICD-10-CM | POA: Diagnosis not present

## 2021-03-05 MED ORDER — BUPROPION HCL ER (SR) 100 MG PO TB12: 100.0000 mg | ORAL_TABLET | Freq: Every day | ORAL | 0 refills | Status: DC

## 2021-03-05 NOTE — Assessment & Plan Note (Signed)
Chronic.  Stable.  Will begin Wellbutrin daily.  Side effects and benefits of medication discussed with patient at visit today.  Patient denies SI/HI ideations.  Return in 1 month for reevaluation.

## 2021-03-05 NOTE — Progress Notes (Signed)
BP 129/71   Pulse 67   Temp 98 F (36.7 C) (Oral)   Ht 5' 11.5" (1.816 m)   Wt 171 lb 3.2 oz (77.7 kg)   SpO2 99%   BMI 23.55 kg/m    Subjective:    Patient ID: Cole Riley, male    DOB: 1996-12-30, 25 y.o.   MRN: [id number]  HPI: Cole Riley is a 25 y.o. male  Chief Complaint  Patient presents with  . New Patient (Initial Visit)   Patient seen today to establish care with a new PCP. Patient denies significant past medical history including hypertension, high cholesterol, diabetes, thyroid problems, depression, and anxiety. Patient's current complaint at visit today includes depression.  Over the last few years patient noticed it getting worse.  He noticed it at the start of college.  Finds getting out of bed difficult and last of interest in things he previously did like going to the gym or playing video games.  Denies SI/HI.  Patient does have feelings of hopelessness and that the world may be better off without him.  But does not plan to hurt himself.  Patient feels like he does have some anxiety.  Patient feels like it is more related to life events.   Flowsheet Row Office Visit from 03/05/2021 in [facility]  PHQ-9 Total Score 6     GAD 7 : Generalized Anxiety Score 03/05/2021  Nervous, Anxious, on Edge 2  Control/stop worrying 1  Worry too much - different things 1  Trouble relaxing 1  Easily annoyed or irritable 2  Afraid - awful might happen 0  Anxiety Difficulty Somewhat difficult      Relevant past medical, surgical, family and social history reviewed and updated as indicated. Interim medical history since our last visit reviewed. Allergies and medications reviewed and updated.  Review of Systems  Eyes: Negative for visual disturbance.  Respiratory: Negative for shortness of breath.   Cardiovascular: Negative for chest pain and leg swelling.  Neurological: Negative for light-headedness and headaches.  Psychiatric/Behavioral: Positive for  dysphoric mood. Negative for suicidal ideas. The patient is nervous/anxious.     Per HPI unless specifically indicated above     Objective:    BP 129/71   Pulse 67   Temp 98 F (36.7 C) (Oral)   Ht 5' 11.5" (1.816 m)   Wt 171 lb 3.2 oz (77.7 kg)   SpO2 99%   BMI 23.55 kg/m   Wt Readings from Last 3 Encounters:  03/05/21 171 lb 3.2 oz (77.7 kg)  07/21/17 167 lb (75.8 kg)  07/02/17 168 lb (76.2 kg)    Physical Exam Vitals and nursing note reviewed.  Constitutional:      General: He is not in acute distress.    Appearance: Normal appearance. He is not ill-appearing, toxic-appearing or diaphoretic.  HENT:     Head: Normocephalic.     Right Ear: External ear normal.     Left Ear: External ear normal.     Nose: Nose normal. No congestion or rhinorrhea.     Mouth/Throat:     Mouth: Mucous membranes are moist.  Eyes:     General:        Right eye: No discharge.        Left eye: No discharge.     Extraocular Movements: Extraocular movements intact.     Conjunctiva/sclera: Conjunctivae normal.     Pupils: Pupils are equal, round, and reactive to light.  Cardiovascular:  Rate and Rhythm: Normal rate and regular rhythm.     Heart sounds: No murmur heard.   Pulmonary:     Effort: Pulmonary effort is normal. No respiratory distress.     Breath sounds: Normal breath sounds. No wheezing, rhonchi or rales.  Abdominal:     General: Abdomen is flat. Bowel sounds are normal.  Musculoskeletal:     Cervical back: Normal range of motion and neck supple.  Skin:    General: Skin is warm and dry.     Capillary Refill: Capillary refill takes less than 2 seconds.  Neurological:     General: No focal deficit present.     Mental Status: He is alert and oriented to person, place, and time.  Psychiatric:        Mood and Affect: Mood normal.        Behavior: Behavior normal.        Thought Content: Thought content normal.        Judgment: Judgment normal.     Results for orders  placed or performed during the hospital encounter of 07/02/17  Urine culture   Specimen: Urine, Clean Catch  Result Value Ref Range   Specimen Description URINE, CLEAN CATCH    Special Requests NONE    Culture (A)     <10,000 COLONIES/mL INSIGNIFICANT GROWTH Performed at [facility], [address], [place], [place] [address]    Report Status 07/04/2017 FINAL   CBC with Differential  Result Value Ref Range   WBC 7.6 3.8 - 10.6 K/uL   RBC 5.53 4.40 - 5.90 MIL/uL   Hemoglobin 16.7 13.0 - 18.0 g/dL   HCT 10.6 26.9 - 48.5 %   MCV 87.3 80.0 - 100.0 fL   MCH 30.2 26.0 - 34.0 pg   MCHC 34.5 32.0 - 36.0 g/dL   RDW 46.2 70.3 - 50.0 %   Platelets 231 150 - 440 K/uL   Neutrophils Relative % 71 %   Neutro Abs 5.4 1.4 - 6.5 K/uL   Lymphocytes Relative 22 %   Lymphs Abs 1.7 1.0 - 3.6 K/uL   Monocytes Relative 7 %   Monocytes Absolute 0.5 0.2 - 1.0 K/uL   Eosinophils Relative 0 %   Eosinophils Absolute 0.0 0 - 0.7 K/uL   Basophils Relative 0 %   Basophils Absolute 0.0 0 - 0.1 K/uL  Comprehensive metabolic panel  Result Value Ref Range   Sodium 140 135 - 145 mmol/L   Potassium 3.9 3.5 - 5.1 mmol/L   Chloride 105 101 - 111 mmol/L   CO2 26 22 - 32 mmol/L   Glucose, Bld 101 (H) 65 - 99 mg/dL   BUN 11 6 - 20 mg/dL   Creatinine, Ser 9.38 0.61 - 1.24 mg/dL   Calcium 9.8 8.9 - 18.2 mg/dL   Total Protein 8.0 6.5 - 8.1 g/dL   Albumin 5.4 (H) 3.5 - 5.0 g/dL   AST 23 15 - 41 U/L   ALT 24 17 - 63 U/L   Alkaline Phosphatase 74 38 - 126 U/L   Total Bilirubin 1.4 (H) 0.3 - 1.2 mg/dL   GFR calc non Af Amer >60 >60 mL/min   GFR calc Af Amer >60 >60 mL/min   Anion gap 9 5 - 15  Lipase, blood  Result Value Ref Range   Lipase 29 11 - 51 U/L  Amylase  Result Value Ref Range   Amylase 71 28 - 100 U/L  Urinalysis, Complete w Microscopic  Result Value Ref Range   Color, Urine STRAW (A) YELLOW   APPearance CLEAR CLEAR   Specific Gravity, Urine 1.015 1.005 - 1.030   pH >9.0 (H) 5.0 - 8.0    Glucose, UA NEGATIVE NEGATIVE mg/dL   Hgb urine dipstick NEGATIVE NEGATIVE   Bilirubin Urine NEGATIVE NEGATIVE   Ketones, ur TRACE (A) NEGATIVE mg/dL   Protein, ur NEGATIVE NEGATIVE mg/dL   Nitrite NEGATIVE NEGATIVE   Leukocytes, UA NEGATIVE NEGATIVE   Squamous Epithelial / LPF NONE SEEN NONE SEEN   WBC, UA 0-5 0 - 5 WBC/hpf   RBC / HPF NONE SEEN 0 - 5 RBC/hpf   Bacteria, UA NONE SEEN NONE SEEN      Assessment & Plan:   Problem List Items Addressed This Visit      Other   Depression, major, single episode, mild (HCC)    Chronic.  Stable.  Will begin Wellbutrin daily.  Side effects and benefits of medication discussed with patient at visit today.  Patient denies SI/HI ideations.  Return in 1 month for reevaluation.        Relevant Medications   buPROPion (WELLBUTRIN SR) 100 MG 12 hr tablet    Other Visit Diagnoses    Encounter to establish care    -  Primary   Return in 1 month for physical and fasting labs.       Follow up plan: Return in about 1 month (around 04/05/2021) for Physical and Fasting labs.   A total of 40 minutes were spent on this encounter today.  When total time is documented, this includes both the face-to-face and non-face-to-face time personally spent before, during and after the visit on the date of the encounter.

## 2021-04-04 NOTE — Progress Notes (Signed)
BP 117/72   Pulse 72   Temp (!) 97.5 F (36.4 C)   Ht 5' 11.54" (1.817 m)   Wt 171 lb 4 oz (77.7 kg)   SpO2 98%   BMI 23.53 kg/m    Subjective:    Patient ID: Cole Riley, male    DOB: March 31, 1996, 25 y.o.   MRN: [id number]  HPI: Cole Lollthan G Elliott is a 25 y.o. male presenting on 04/05/2021 for comprehensive medical examination. Current medical complaints include:depression   Interim Problems from his last visit: no  DEPRESSION/ANXIETY Patient was started on Wellbutrin 1 month ago for depression.  Patient reports that for the first week he noticed a big improvement but since then he doesn't notice much of a difference in his mood.  Agreed that scores are worse because we are discussing more about his depression and becoming more comfortable talking about it. Is the patient deaf or have difficulty hearing?: No Does the patient have difficulty seeing, even when wearing glasses/contacts?: No Does the patient have difficulty concentrating, remembering, or making decisions?: No Does the patient have difficulty walking or climbing stairs?: No Does the patient have difficulty dressing or bathing?: No Does the patient have difficulty doing errands alone such as visiting a doctor's office or shopping?: No  FALL RISK: Fall Risk  03/05/2021  Falls in the past year? 0    Depression Screen [facility] 04/05/2021 03/05/2021  Decreased Interest 1 2  Down, Depressed, Hopeless 2 2  PHQ - 2 Score 3 4  Altered sleeping 2 0  Tired, decreased energy 2 1  Change in appetite 1 0  Feeling bad or failure about yourself  2 1  Trouble concentrating 0 0  Moving slowly or fidgety/restless 0 0  Suicidal thoughts 1 0  PHQ-9 Score 11 6  Difficult doing work/chores Very difficult Not difficult at all    GAD 7 : Generalized Anxiety Score 04/05/2021 03/05/2021  Nervous, Anxious, on Edge 2 2  Control/stop worrying 2 1  Worry too much - different things 1 1  Trouble relaxing 2 1  Restless 0  -  Easily annoyed or irritable 2 2  Afraid - awful might happen 0 0  Total GAD 7 Score 9 -  Anxiety Difficulty Somewhat difficult Somewhat difficult     Advanced Directives <no information>  Past Medical History:  Past Medical History:  Diagnosis Date  . GERD (gastroesophageal reflux disease)     Surgical History:  Past Surgical History:  Procedure Laterality Date  . fenulectomy      Medications:  No current outpatient medications on file prior to visit.   No current facility-administered medications on file prior to visit.    Allergies:  No Known Allergies  Social History:  Social History   Socioeconomic History  . Marital status: Single    Spouse name: Not on file  . Number of children: Not on file  . Years of education: Not on file  . Highest education level: Not on file  Occupational History  . Not on file  Tobacco Use  . Smoking status: Never Smoker  . Smokeless tobacco: Never Used  Vaping Use  . Vaping Use: Never used  Substance and Sexual Activity  . Alcohol use: Yes    Alcohol/week: 2.0 standard drinks    Types: 2 Cans of beer per week    Comment:  2 to 3 beers weekly  . Drug use: No  . Sexual activity: Yes    Birth  control/protection: None  Other Topics Concern  . Not on file  Social History Narrative  . Not on file   Social Determinants of Health   Financial Resource Strain: Not on file  Food Insecurity: Not on file  Transportation Needs: Not on file  Physical Activity: Not on file  Stress: Not on file  Social Connections: Not on file  Intimate Partner Violence: Not on file   Social History   Tobacco Use  Smoking Status Never Smoker  Smokeless Tobacco Never Used   Social History   Substance and Sexual Activity  Alcohol Use Yes  . Alcohol/week: 2.0 standard drinks  . Types: 2 Cans of beer per week   Comment:  2 to 3 beers weekly    Family History:  Family History  Problem Relation Age of Onset  . Sarcoidosis Father   .  Diabetes Maternal Grandmother   . Hypertension Maternal Grandmother   . Heart disease Paternal Grandmother   . Colon cancer Paternal Grandfather   . Cancer Paternal Grandfather        brain  . Hyperlipidemia Maternal Grandfather     Past medical history, surgical history, medications, allergies, family history and social history reviewed with patient today and changes made to appropriate areas of the chart.   Review of Systems - Depression, anxiety, denies SI. All other ROS negative except what is listed above and in the HPI.      Objective:    BP 117/72   Pulse 72   Temp (!) 97.5 F (36.4 C)   Ht 5' 11.54" (1.817 m)   Wt 171 lb 4 oz (77.7 kg)   SpO2 98%   BMI 23.53 kg/m   Wt Readings from Last 3 Encounters:  04/05/21 171 lb 4 oz (77.7 kg)  03/05/21 171 lb 3.2 oz (77.7 kg)  07/21/17 167 lb (75.8 kg)    Physical Exam Vitals and nursing note reviewed.  Constitutional:      General: He is not in acute distress.    Appearance: Normal appearance. He is not ill-appearing, toxic-appearing or diaphoretic.  HENT:     Head: Normocephalic.     Right Ear: Tympanic membrane, ear canal and external ear normal.     Left Ear: Tympanic membrane, ear canal and external ear normal.     Nose: Nose normal. No congestion or rhinorrhea.     Mouth/Throat:     Mouth: Mucous membranes are moist.  Eyes:     General:        Right eye: No discharge.        Left eye: No discharge.     Extraocular Movements: Extraocular movements intact.     Conjunctiva/sclera: Conjunctivae normal.     Pupils: Pupils are equal, round, and reactive to light.  Cardiovascular:     Rate and Rhythm: Normal rate and regular rhythm.     Heart sounds: No murmur heard.   Pulmonary:     Effort: Pulmonary effort is normal. No respiratory distress.     Breath sounds: Normal breath sounds. No wheezing, rhonchi or rales.  Abdominal:     General: Abdomen is flat. Bowel sounds are normal. There is no distension.      Palpations: Abdomen is soft.     Tenderness: There is no abdominal tenderness. There is no guarding.  Musculoskeletal:     Cervical back: Normal range of motion and neck supple.  Skin:    General: Skin is warm and dry.     Capillary  Refill: Capillary refill takes less than 2 seconds.  Neurological:     General: No focal deficit present.     Mental Status: He is alert and oriented to person, place, and time.     Cranial Nerves: No cranial nerve deficit.     Motor: No weakness.     Deep Tendon Reflexes: Reflexes normal.  Psychiatric:        Mood and Affect: Mood normal.        Behavior: Behavior normal.        Thought Content: Thought content normal.        Judgment: Judgment normal.      Results for orders placed or performed during the hospital encounter of 07/02/17  Urine culture   Specimen: Urine, Clean Catch  Result Value Ref Range   Specimen Description URINE, CLEAN CATCH    Special Requests NONE    Culture (A)     <10,000 COLONIES/mL INSIGNIFICANT GROWTH Performed at [facility], [address], [place], [place] [address]    Report Status 07/04/2017 FINAL   CBC with Differential  Result Value Ref Range   WBC 7.6 3.8 - 10.6 K/uL   RBC 5.53 4.40 - 5.90 MIL/uL   Hemoglobin 16.7 13.0 - 18.0 g/dL   HCT 57.3 22.0 - 25.4 %   MCV 87.3 80.0 - 100.0 fL   MCH 30.2 26.0 - 34.0 pg   MCHC 34.5 32.0 - 36.0 g/dL   RDW 27.0 62.3 - 76.2 %   Platelets 231 150 - 440 K/uL   Neutrophils Relative % 71 %   Neutro Abs 5.4 1.4 - 6.5 K/uL   Lymphocytes Relative 22 %   Lymphs Abs 1.7 1.0 - 3.6 K/uL   Monocytes Relative 7 %   Monocytes Absolute 0.5 0.2 - 1.0 K/uL   Eosinophils Relative 0 %   Eosinophils Absolute 0.0 0 - 0.7 K/uL   Basophils Relative 0 %   Basophils Absolute 0.0 0 - 0.1 K/uL  Comprehensive metabolic panel  Result Value Ref Range   Sodium 140 135 - 145 mmol/L   Potassium 3.9 3.5 - 5.1 mmol/L   Chloride 105 101 - 111 mmol/L   CO2 26 22 - 32 mmol/L   Glucose,  Bld 101 (H) 65 - 99 mg/dL   BUN 11 6 - 20 mg/dL   Creatinine, Ser 8.31 0.61 - 1.24 mg/dL   Calcium 9.8 8.9 - 51.7 mg/dL   Total Protein 8.0 6.5 - 8.1 g/dL   Albumin 5.4 (H) 3.5 - 5.0 g/dL   AST 23 15 - 41 U/L   ALT 24 17 - 63 U/L   Alkaline Phosphatase 74 38 - 126 U/L   Total Bilirubin 1.4 (H) 0.3 - 1.2 mg/dL   GFR calc non Af Amer >60 >60 mL/min   GFR calc Af Amer >60 >60 mL/min   Anion gap 9 5 - 15  Lipase, blood  Result Value Ref Range   Lipase 29 11 - 51 U/L  Amylase  Result Value Ref Range   Amylase 71 28 - 100 U/L  Urinalysis, Complete w Microscopic  Result Value Ref Range   Color, Urine STRAW (A) YELLOW   APPearance CLEAR CLEAR   Specific Gravity, Urine 1.015 1.005 - 1.030   pH >9.0 (H) 5.0 - 8.0   Glucose, UA NEGATIVE NEGATIVE mg/dL   Hgb urine dipstick NEGATIVE NEGATIVE   Bilirubin Urine NEGATIVE NEGATIVE   Ketones, ur TRACE (A) NEGATIVE mg/dL   Protein,  ur NEGATIVE NEGATIVE mg/dL   Nitrite NEGATIVE NEGATIVE   Leukocytes, UA NEGATIVE NEGATIVE   Squamous Epithelial / LPF NONE SEEN NONE SEEN   WBC, UA 0-5 0 - 5 WBC/hpf   RBC / HPF NONE SEEN 0 - 5 RBC/hpf   Bacteria, UA NONE SEEN NONE SEEN      Assessment & Plan:   Problem List Items Addressed This Visit      Cardiovascular and Mediastinum   Migraine without aura and without status migrainosus, not intractable    Chronic.  Controlled.  Continue with current regimen.  Follow up if symptoms worsen.      Relevant Medications   buPROPion (WELLBUTRIN SR) 200 MG 12 hr tablet     Other   Depression, major, single episode, mild (HCC)    Chronic. Uncontrolled.  Increase Wellbutrin to 200mg  daily.  Discussed that if the increased dose does not help, may need to change to a different medication that would be more effective for him.  Patient understands and agrees with the plan.  Patient has had thoughts of suicide but denies a plan or intentions to follow through.  Information provided to patient about Crisis Hotline.   Follow up in one month for reevaluation.      Relevant Medications   buPROPion (WELLBUTRIN SR) 200 MG 12 hr tablet    Other Visit Diagnoses    Annual physical exam    -  Primary   Health Maintenance reviewed.  Labs ordered.  Will obtain TDAP information. Believes he received HPV but would like to get it if he didn't.   Relevant Orders   TSH   Lipid panel   CBC with Differential/Platelet   Comprehensive metabolic panel   Urinalysis, Routine w reflex microscopic   HgB A1c   Encounter for hepatitis C screening test for low risk patient       Relevant Orders   Hepatitis C Antibody   Screening for HIV (human immunodeficiency virus)       Relevant Orders   HIV Antibody (routine testing w rflx)       Preventative Services:  Health Risk Assessment and Personalized Prevention Plan: CVD Screening: NA Colon Cancer Screening: NA Depression Screening: Done Today Diabetes Screening: Done Today Glaucoma Screening: NA Hepatitis B vaccine: NA Hepatitis C screening: Done Today HIV Screening: Done Today Flu Vaccine: Postponed to flu season Lung cancer Screening: NA Obesity Screening: Done Today Pneumonia Vaccines (2): NA STI Screening: denies PSA screening: NA  Discussed aspirin prophylaxis for myocardial infarction prevention and decision was it was not indicated  LABORATORY TESTING:  Health maintenance labs ordered today as discussed above.     IMMUNIZATIONS:   - Tdap: Tetanus vaccination status reviewed: last tetanus booster within 10 years. - Influenza: Postponed to flu season - Pneumovax: Not applicable - Prevnar: Not applicable - Zostavax vaccine: Not applicable  SCREENING: - Colonoscopy: Not applicable  Discussed with patient purpose of the colonoscopy is to detect colon cancer at curable precancerous or early stages   - AAA Screening: Not applicable  -Hearing Test: Not applicable  -Spirometry: Not applicable   PATIENT COUNSELING:    Sexuality: Discussed sexually  transmitted diseases, partner selection, use of condoms, avoidance of unintended pregnancy  and contraceptive alternatives.   Advised to avoid cigarette smoking.  I discussed with the patient that most people either abstain from alcohol or drink within safe limits (<=14/week and <=4 drinks/occasion for males, <=7/weeks and <= 3 drinks/occasion for females) and that the risk for  alcohol disorders and other health effects rises proportionally with the number of drinks per week and how often a drinker exceeds daily limits.  Discussed cessation/primary prevention of drug use and availability of treatment for abuse.   Diet: Encouraged to adjust caloric intake to maintain  or achieve ideal body weight, to reduce intake of dietary saturated fat and total fat, to limit sodium intake by avoiding high sodium foods and not adding table salt, and to maintain adequate dietary potassium and calcium preferably from fresh fruits, vegetables, and low-fat dairy products.    stressed the importance of regular exercise  Injury prevention: Discussed safety belts, safety helmets, smoke detector, smoking near bedding or upholstery.   Dental health: Discussed importance of regular tooth brushing, flossing, and dental visits.   Follow up plan: NEXT PREVENTATIVE PHYSICAL DUE IN 1 YEAR. Return in about 1 month (around 05/05/2021) for Depression/Anxiety FU.

## 2021-04-05 ENCOUNTER — Other Ambulatory Visit: Payer: Self-pay

## 2021-04-05 ENCOUNTER — Ambulatory Visit (INDEPENDENT_AMBULATORY_CARE_PROVIDER_SITE_OTHER): Payer: 59 | Admitting: Nurse Practitioner

## 2021-04-05 ENCOUNTER — Encounter: Payer: Self-pay | Admitting: Nurse Practitioner

## 2021-04-05 VITALS — BP 117/72 | HR 72 | Temp 97.5°F | Ht 71.54 in | Wt 171.2 lb

## 2021-04-05 DIAGNOSIS — G43009 Migraine without aura, not intractable, without status migrainosus: Secondary | ICD-10-CM

## 2021-04-05 DIAGNOSIS — Z1159 Encounter for screening for other viral diseases: Secondary | ICD-10-CM

## 2021-04-05 DIAGNOSIS — Z114 Encounter for screening for human immunodeficiency virus [HIV]: Secondary | ICD-10-CM

## 2021-04-05 DIAGNOSIS — Z0000 Encounter for general adult medical examination without abnormal findings: Secondary | ICD-10-CM | POA: Diagnosis not present

## 2021-04-05 DIAGNOSIS — F320 Major depressive disorder, single episode, mild: Secondary | ICD-10-CM

## 2021-04-05 DIAGNOSIS — K219 Gastro-esophageal reflux disease without esophagitis: Secondary | ICD-10-CM

## 2021-04-05 LAB — URINALYSIS, ROUTINE W REFLEX MICROSCOPIC
Bilirubin, UA: NEGATIVE
Glucose, UA: NEGATIVE
Ketones, UA: NEGATIVE
Leukocytes,UA: NEGATIVE
Nitrite, UA: NEGATIVE
Protein,UA: NEGATIVE
RBC, UA: NEGATIVE
Specific Gravity, UA: 1.01 (ref 1.005–1.030)
Urobilinogen, Ur: 0.2 mg/dL (ref 0.2–1.0)
pH, UA: 6.5 (ref 5.0–7.5)

## 2021-04-05 MED ORDER — BUPROPION HCL ER (SR) 200 MG PO TB12: 200.0000 mg | ORAL_TABLET | Freq: Every day | ORAL | 1 refills | Status: DC

## 2021-04-05 NOTE — Assessment & Plan Note (Addendum)
Chronic. Uncontrolled.  Increase Wellbutrin to 200mg  daily.  Discussed that if the increased dose does not help, may need to change to a different medication that would be more effective for him.  Patient understands and agrees with the plan.  Patient has had thoughts of suicide but denies a plan or intentions to follow through.  Information provided to patient about Crisis Hotline.  Follow up in one month for reevaluation.

## 2021-04-05 NOTE — Assessment & Plan Note (Signed)
Chronic.  Controlled.  Continue with current regimen.  Follow up if symptoms worsen.

## 2021-04-05 NOTE — Patient Instructions (Signed)
Suicidal Feelings: How to Help Yourself Suicide is when you end your own life. There are many things you can do to help yourself feel better when struggling with these feelings. Many services and people are available to support you and others who struggle with similar feelings.  If you ever feel like you may hurt yourself or others, or have thoughts about taking your own life, get help right away. To get help:  Call your local emergency services (911 in the [place]  [employer]'s health and human services helpline (211 in the [place]  Go to your nearest emergency department.  Call a suicide hotline to speak with a trained counselor. The following suicide hotlines are available in the [place]: ? 1-800-273-TALK (1-800-273-8255). ? 1-800-SUICIDE (1-800-784-2433). ? 1-888-628-9454. This is a hotline for Spanish speakers. ? 1-800-799-4889. This is a hotline for TTY users. ? 1-866-4-U-TREVOR (1-866-488-7386). This is a hotline for lesbian, gay, bisexual, transgender, or questioning youth. ? For a list of hotlines in [place], visit www.suicide.org/hotlines/international/[place]-suicide-hotlines.html  Contact a crisis center or a local suicide prevention center. To find a crisis center or suicide prevention center: ? Call your local hospital, clinic, community service organization, mental health center, social service provider, or health department. Ask for help with connecting to a crisis center. ? For a list of crisis centers in the [place], visit: suicidepreventionlifeline.org ? For a list of crisis centers in [place], visit: suicideprevention.ca How to help yourself feel better  Promise yourself that you will not do anything extreme when you have suicidal feelings. Remember the times you have felt hopeful. Many people have gotten through suicidal thoughts and feelings, and you can too. If you have had these feelings before, remind yourself that you can get through them again.  Let  family, friends, teachers, or counselors know how you are feeling. Try not to separate yourself from those who care about you and want to help you. Talk with someone every day, even if you do not feel sociable. Face-to-face conversation is best to help them understand your feelings.  Contact a mental health care provider and work with this person regularly.  Make a safety plan that you can follow during a crisis. Include phone numbers of suicide prevention hotlines, mental health professionals, and trusted friends and family members you can call during an emergency. Save these numbers on your phone.  If you are thinking of taking a lot of medicine, give your medicine to someone who can give it to you as prescribed. If you are on antidepressants and are concerned you will overdose, tell your health care provider so that he or she can give you safer medicines.  Try to stick to your routines and follow a schedule every day. Make self-care a priority.  Make a list of realistic goals, and cross them off when you achieve them. Accomplishments can give you a sense of worth.  Wait until you are feeling better before doing things that you find difficult or unpleasant.  Do things that you have always enjoyed to take your mind off your feelings. Try reading a book, or listening to or playing music. Spending time outside, in nature, may help you feel better.   Follow these instructions at home:  Visit your primary health care provider every year for a checkup.  Work with a mental health care provider as needed.  Eat a well-balanced diet, and eat regular meals.  Get plenty of rest.  Exercise if you are able. Just 30 minutes of   exercise each day can help you feel better.  Take over-the-counter and prescription medicines only as told by your health care provider. Ask your mental health care provider about the possible side effects of any medicines you are taking.  Do not use alcohol or drugs, and  remove these substances from your home.  Remove weapons, poisons, knives, and other deadly items from your home.   General recommendations  Keep your living space well lit.  When you are feeling well, write yourself a letter with tips and support that you can read when you are not feeling well.  Remember that life's difficulties can be sorted out with help. Conditions can be treated, and you can learn behaviors and ways of thinking that will help you. Where to find more information  National Suicide Prevention Lifeline: www.suicidepreventionlifeline.org  Hopeline: www.hopeline.com  American Foundation for Suicide Prevention: www.afsp.org  The Trevor Project (for lesbian, gay, bisexual, transgender, or questioning youth): www.thetrevorproject.org  National Institute of Mental Health: https://www.nimh.nih.gov/health/topics/suicide-prevention Contact a health care provider if:  You feel as though you are a burden to others.  You feel agitated, angry, vengeful, or have extreme mood swings.  You have withdrawn from family and friends. Get help right away if:  You are talking about suicide or wishing to die.  You start making plans for how to commit suicide.  You feel that you have no reason to live.  You start making plans for putting your affairs in order, saying goodbye, or giving your possessions away.  You feel guilt, shame, or unbearable pain, and it seems like there is no way out.  You are frequently using drugs or alcohol.  You are engaging in risky behaviors that could lead to death. If you have any of these symptoms, get help right away. Call emergency services, go to your nearest emergency department or crisis center, or call a suicide crisis helpline. Summary  Suicide is when you take your own life.  Promise yourself that you will not do anything extreme when you have suicidal feelings.  Let family, friends, teachers, or counselors know how you are  feeling.  Get help right away if you start making plans for how to commit suicide. This information is not intended to replace advice given to you by your health care provider. Make sure you discuss any questions you have with your health care provider. Document Revised: 09/01/2020 Document Reviewed: 09/01/2020 Elsevier Patient Education  2021 [employer]  

## 2021-04-06 LAB — COMPREHENSIVE METABOLIC PANEL
ALT: 22 IU/L (ref 0–44)
AST: 19 IU/L (ref 0–40)
Albumin/Globulin Ratio: 2.5 — ABNORMAL HIGH (ref 1.2–2.2)
Albumin: 5 g/dL (ref 4.1–5.2)
Alkaline Phosphatase: 59 IU/L (ref 44–121)
BUN/Creatinine Ratio: 14 (ref 9–20)
BUN: 14 mg/dL (ref 6–20)
Bilirubin Total: 0.6 mg/dL (ref 0.0–1.2)
CO2: 24 mmol/L (ref 20–29)
Calcium: 9.5 mg/dL (ref 8.7–10.2)
Chloride: 100 mmol/L (ref 96–106)
Creatinine, Ser: 1.02 mg/dL (ref 0.76–1.27)
Globulin, Total: 2 g/dL (ref 1.5–4.5)
Glucose: 88 mg/dL (ref 65–99)
Potassium: 3.9 mmol/L (ref 3.5–5.2)
Sodium: 141 mmol/L (ref 134–144)
Total Protein: 7 g/dL (ref 6.0–8.5)
eGFR: 105 mL/min/{1.73_m2} (ref >59)

## 2021-04-06 LAB — CBC WITH DIFFERENTIAL/PLATELET
Basophils Absolute: 0 10*3/uL (ref 0.0–0.2)
Basos: 0 %
EOS (ABSOLUTE): 0.1 10*3/uL (ref 0.0–0.4)
Eos: 2 %
Hematocrit: 47 % (ref 37.5–51.0)
Hemoglobin: 15.5 g/dL (ref 13.0–17.7)
Immature Grans (Abs): 0 10*3/uL (ref 0.0–0.1)
Immature Granulocytes: 1 %
Lymphocytes Absolute: 1.9 10*3/uL (ref 0.7–3.1)
Lymphs: 41 %
MCH: 30 pg (ref 26.6–33.0)
MCHC: 33 g/dL (ref 31.5–35.7)
MCV: 91 fL (ref 79–97)
Monocytes Absolute: 0.4 10*3/uL (ref 0.1–0.9)
Monocytes: 9 %
Neutrophils Absolute: 2.2 10*3/uL (ref 1.4–7.0)
Neutrophils: 47 %
Platelets: 223 10*3/uL (ref 150–450)
RBC: 5.16 x10E6/uL (ref 4.14–5.80)
RDW: 12.9 % (ref 11.6–15.4)
WBC: 4.6 10*3/uL (ref 3.4–10.8)

## 2021-04-06 LAB — HEMOGLOBIN A1C
Est. average glucose Bld gHb Est-mCnc: 97 mg/dL
Hgb A1c MFr Bld: 5 % (ref 4.8–5.6)

## 2021-04-06 LAB — LIPID PANEL
Chol/HDL Ratio: 3.2 ratio (ref 0.0–5.0)
Cholesterol, Total: 158 mg/dL (ref 100–199)
HDL: 50 mg/dL (ref >39)
LDL Chol Calc (NIH): 95 mg/dL (ref 0–99)
Triglycerides: 68 mg/dL (ref 0–149)
VLDL Cholesterol Cal: 13 mg/dL (ref 5–40)

## 2021-04-06 LAB — HEPATITIS C ANTIBODY: Hep C Virus Ab: <0.1 s/co ratio (ref 0.0–0.9)

## 2021-04-06 LAB — HIV ANTIBODY (ROUTINE TESTING W REFLEX): HIV Screen 4th Generation wRfx: NONREACTIVE

## 2021-04-06 LAB — TSH: TSH: 1.78 u[IU]/mL (ref 0.450–4.500)

## 2021-04-06 NOTE — Progress Notes (Signed)
Hi Cole Riley.  It was nice seeing you yesterday.  Your lab work looks great.  No cause for concern.  No reason to begin medication.  We will just repeat it in a year.  See you at your follow up.

## 2021-04-18 ENCOUNTER — Other Ambulatory Visit: Payer: Self-pay | Admitting: Nurse Practitioner

## 2021-04-18 DIAGNOSIS — F320 Major depressive disorder, single episode, mild: Secondary | ICD-10-CM

## 2021-05-04 NOTE — Progress Notes (Signed)
BP 110/66   Pulse 77   Temp (!) 97.5 F (36.4 C)   Wt 166 lb (75.3 kg)   SpO2 99%   BMI 22.81 kg/m    Subjective:    Patient ID: Cole Riley, male    DOB: Oct 24, 1996, 25 y.o.   MRN: [id number]  HPI: Cole Riley is a 25 y.o. male  Chief Complaint  Patient presents with  . Depression  . Anxiety   DEPRESSION/ANXIETY Patient states that the depression worsened during the medication adjustment period.  Patient states in the last 2-2.5 weeks he hasn't had any suicidal thoughts. He has more energy, less negativity, and able to hold conversations easier. Denies SI in office today.  When he did have SI he did not have a plan to carry out suicide. However, since increasing the dose of Wellbutrin patient's anxiety has worsened.  He states it has improved since the initial two weeks but it is still worse than when he was not on mediation.   [facility] Office Visit from 05/07/2021 in [facility]  PHQ-9 Total Score 11     GAD 7 : Generalized Anxiety Score 05/07/2021 04/05/2021 03/05/2021  Nervous, Anxious, on Edge _0 Control/stop worrying _1 Worry too much - different things _2 Trouble relaxing _3 Restless 0 0 -  Easily annoyed or irritable _4 Afraid - awful might happen 1 0 0  Total GAD 7 Score 13 9 -  Anxiety Difficulty Somewhat difficult Somewhat difficult Somewhat difficult      Denies HA, CP, SOB, dizziness, palpitations, visual changes, and lower extremity swelling.   Relevant past medical, surgical, family and social history reviewed and updated as indicated. Interim medical history since our last visit reviewed. Allergies and medications reviewed and updated.  Review of Systems  Eyes: Negative for visual disturbance.  Respiratory: Negative for shortness of breath.   Cardiovascular: Negative for chest pain and leg swelling.  Neurological: Negative for light-headedness and headaches.  Psychiatric/Behavioral: Positive for dysphoric  mood. Negative for suicidal ideas. The patient is nervous/anxious.     Per HPI unless specifically indicated above     Objective:    BP 110/66   Pulse 77   Temp (!) 97.5 F (36.4 C)   Wt 166 lb (75.3 kg)   SpO2 99%   BMI 22.81 kg/m   Wt Readings from Last 3 Encounters:  05/07/21 166 lb (75.3 kg)  04/05/21 171 lb 4 oz (77.7 kg)  03/05/21 171 lb 3.2 oz (77.7 kg)    Physical Exam Vitals and nursing note reviewed.  Constitutional:      General: He is not in acute distress.    Appearance: Normal appearance. He is not ill-appearing, toxic-appearing or diaphoretic.  HENT:     Head: Normocephalic.     Right Ear: External ear normal.     Left Ear: External ear normal.     Nose: Nose normal. No congestion or rhinorrhea.     Mouth/Throat:     Mouth: Mucous membranes are moist.  Eyes:     General:        Right eye: No discharge.        Left eye: No discharge.     Extraocular Movements: Extraocular movements intact.     Conjunctiva/sclera: Conjunctivae normal.     Pupils: Pupils are equal, round, and reactive to light.  Cardiovascular:     Rate and Rhythm:  Normal rate and regular rhythm.     Heart sounds: No murmur heard.   Pulmonary:     Effort: Pulmonary effort is normal. No respiratory distress.     Breath sounds: Normal breath sounds. No wheezing, rhonchi or rales.  Abdominal:     General: Abdomen is flat. Bowel sounds are normal.  Musculoskeletal:     Cervical back: Normal range of motion and neck supple.  Skin:    General: Skin is warm and dry.     Capillary Refill: Capillary refill takes less than 2 seconds.  Neurological:     General: No focal deficit present.     Mental Status: He is alert and oriented to person, place, and time.  Psychiatric:        Mood and Affect: Mood normal.        Behavior: Behavior normal.        Thought Content: Thought content normal.        Judgment: Judgment normal.     Results for orders placed or performed in visit on  04/05/21  TSH  Result Value Ref Range   TSH 1.780 0.450 - 4.500 uIU/mL  Lipid panel  Result Value Ref Range   Cholesterol, Total 158 100 - 199 mg/dL   Triglycerides 68 0 - 149 mg/dL   HDL 50 >39 mg/dL   VLDL Cholesterol Cal 13 5 - 40 mg/dL   LDL Chol Calc (NIH) 95 0 - 99 mg/dL   Chol/HDL Ratio 3.2 0.0 - 5.0 ratio  CBC with Differential/Platelet  Result Value Ref Range   WBC 4.6 3.4 - 10.8 x10E3/uL   RBC 5.16 4.14 - 5.80 x10E6/uL   Hemoglobin 15.5 13.0 - 17.7 g/dL   Hematocrit 47.0 37.5 - 51.0 %   MCV 91 79 - 97 fL   MCH 30.0 26.6 - 33.0 pg   MCHC 33.0 31.5 - 35.7 g/dL   RDW 12.9 11.6 - 15.4 %   Platelets 223 150 - 450 x10E3/uL   Neutrophils 47 Not Estab. %   Lymphs 41 Not Estab. %   Monocytes 9 Not Estab. %   Eos 2 Not Estab. %   Basos 0 Not Estab. %   Neutrophils Absolute 2.2 1.4 - 7.0 x10E3/uL   Lymphocytes Absolute 1.9 0.7 - 3.1 x10E3/uL   Monocytes Absolute 0.4 0.1 - 0.9 x10E3/uL   EOS (ABSOLUTE) 0.1 0.0 - 0.4 x10E3/uL   Basophils Absolute 0.0 0.0 - 0.2 x10E3/uL   Immature Granulocytes 1 Not Estab. %   Immature Grans (Abs) 0.0 0.0 - 0.1 x10E3/uL  Comprehensive metabolic panel  Result Value Ref Range   Glucose 88 65 - 99 mg/dL   BUN 14 6 - 20 mg/dL   Creatinine, Ser 1.02 0.76 - 1.27 mg/dL   eGFR 105 >59 mL/min/1.73   BUN/Creatinine Ratio 14 9 - 20   Sodium 141 134 - 144 mmol/L   Potassium 3.9 3.5 - 5.2 mmol/L   Chloride 100 96 - 106 mmol/L   CO2 24 20 - 29 mmol/L   Calcium 9.5 8.7 - 10.2 mg/dL   Total Protein 7.0 6.0 - 8.5 g/dL   Albumin 5.0 4.1 - 5.2 g/dL   Globulin, Total 2.0 1.5 - 4.5 g/dL   Albumin/Globulin Ratio 2.5 (H) 1.2 - 2.2   Bilirubin Total 0.6 0.0 - 1.2 mg/dL   Alkaline Phosphatase 59 44 - 121 IU/L   AST 19 0 - 40 IU/L   ALT 22 0 - 44 IU/L  Urinalysis, Routine w  reflex microscopic  Result Value Ref Range   Specific Gravity, UA 1.010 1.005 - 1.030   pH, UA 6.5 5.0 - 7.5   Color, UA Yellow Yellow   Appearance Ur Clear Clear   Leukocytes,UA  Negative Negative   Protein,UA Negative Negative/Trace   Glucose, UA Negative Negative   Ketones, UA Negative Negative   RBC, UA Negative Negative   Bilirubin, UA Negative Negative   Urobilinogen, Ur 0.2 0.2 - 1.0 mg/dL   Nitrite, UA Negative Negative  Hepatitis C Antibody  Result Value Ref Range   Hep C Virus Ab <0.1 0.0 - 0.9 s/co ratio  HIV Antibody (routine testing w rflx)  Result Value Ref Range   HIV Screen 4th Generation wRfx Non Reactive Non Reactive  HgB A1c  Result Value Ref Range   Hgb A1c MFr Bld 5.0 4.8 - 5.6 %   Est. average glucose Bld gHb Est-mCnc 97 mg/dL      Assessment & Plan:   Problem List Items Addressed This Visit      Other   Depression, major, single episode, mild (HCC) - Primary    Chronic.  Improving.  Will continue with Wellbutrin for 2 more weeks to see if anxiety improves.  If patient continues to have worsening anxiety will change medication to another class. Return in 2 weeks for follow up.      Anxiety    Chronic.  Not well controlled.  Will continue with Wellbutrin for 2 more weeks to see if anxiety improves.  If patient continues to have worsening anxiety will change medication to another class. Return in 2 weeks for follow up.          Follow up plan: Return in about 2 years (around 05/08/2023) for Depression/Anxiety FU (virtual).   A total of 20 minutes were spent on this encounter today.  When total time is documented, this includes both the face-to-face and non-face-to-face time personally spent before, during and after the visit on the date of the encounter.

## 2021-05-07 ENCOUNTER — Other Ambulatory Visit: Payer: Self-pay

## 2021-05-07 ENCOUNTER — Ambulatory Visit: Payer: 59 | Admitting: Nurse Practitioner

## 2021-05-07 ENCOUNTER — Encounter: Payer: Self-pay | Admitting: Nurse Practitioner

## 2021-05-07 VITALS — BP 110/66 | HR 77 | Temp 97.5°F | Wt 166.0 lb

## 2021-05-07 DIAGNOSIS — F320 Major depressive disorder, single episode, mild: Secondary | ICD-10-CM | POA: Diagnosis not present

## 2021-05-07 DIAGNOSIS — F419 Anxiety disorder, unspecified: Secondary | ICD-10-CM | POA: Insufficient documentation

## 2021-05-07 NOTE — Assessment & Plan Note (Addendum)
Chronic.  Not well controlled.  Will continue with Wellbutrin for 2 more weeks to see if anxiety improves.  If patient continues to have worsening anxiety will change medication to another class. Return in 2 weeks for follow up.

## 2021-05-07 NOTE — Assessment & Plan Note (Signed)
Chronic.  Improving.  Will continue with Wellbutrin for 2 more weeks to see if anxiety improves.  If patient continues to have worsening anxiety will change medication to another class. Return in 2 weeks for follow up.

## 2021-05-21 ENCOUNTER — Telehealth: Payer: Self-pay

## 2021-05-21 ENCOUNTER — Telehealth (INDEPENDENT_AMBULATORY_CARE_PROVIDER_SITE_OTHER): Payer: 59 | Admitting: Nurse Practitioner

## 2021-05-21 ENCOUNTER — Encounter: Payer: Self-pay | Admitting: Nurse Practitioner

## 2021-05-21 DIAGNOSIS — K219 Gastro-esophageal reflux disease without esophagitis: Secondary | ICD-10-CM | POA: Diagnosis not present

## 2021-05-21 DIAGNOSIS — F320 Major depressive disorder, single episode, mild: Secondary | ICD-10-CM

## 2021-05-21 DIAGNOSIS — F419 Anxiety disorder, unspecified: Secondary | ICD-10-CM

## 2021-05-21 MED ORDER — BUPROPION HCL ER (XL) 300 MG PO TB24: 300.0000 mg | ORAL_TABLET | Freq: Every day | ORAL | 0 refills | Status: DC

## 2021-05-21 NOTE — Progress Notes (Signed)
There were no vitals taken for this visit.   Subjective:    Patient ID: Cole Riley, male    DOB: [birth year], 25 y.o.   MRN: [id number]  HPI: Cole Riley is a 25 y.o. male  Chief Complaint  Patient presents with  . Anxiety  . Depression    DEPRESSION/ANXIETY Patient states he is feeling a lot better.  He feels like things are a lot better than before. He does not find the depression keeping him from getting out of bed or having panic attacks which is an improvement from the previous visit. Denies SI. Flowsheet Row Video Visit from 05/21/2021 in [facility]  PHQ-9 Total Score 5      GAD 7 : Generalized Anxiety Score 05/21/2021 05/07/2021 04/05/2021 03/05/2021  Nervous, Anxious, on Edge 1 3 2 2   Control/stop worrying 1 3 2 1   Worry too much - different things 0 2 1 1   Trouble relaxing 0 2 2 1   Restless 0 0 0 -  Easily annoyed or irritable 2 2 2 2   Afraid - awful might happen 1 1 0 0  Total GAD 7 Score 5 13 9  -  Anxiety Difficulty Somewhat difficult Somewhat difficult Somewhat difficult Somewhat difficult     Relevant past medical, surgical, family and social history reviewed and updated as indicated. Interim medical history since our last visit reviewed. Allergies and medications reviewed and updated.  Review of Systems  Psychiatric/Behavioral: Positive for dysphoric mood. Negative for suicidal ideas. The patient is nervous/anxious.     Per HPI unless specifically indicated above     Objective:    There were no vitals taken for this visit.  Wt Readings from Last 3 Encounters:  05/07/21 166 lb (75.3 kg)  04/05/21 171 lb 4 oz (77.7 kg)  03/05/21 171 lb 3.2 oz (77.7 kg)    Physical Exam Vitals and nursing note reviewed.  Constitutional:      General: He is not in acute distress.    Appearance: He is not ill-appearing.  HENT:     Head: Normocephalic.     Right Ear: Hearing normal.     Left Ear: Hearing normal.     Nose: Nose normal.   Pulmonary:     Effort: Pulmonary effort is normal. No respiratory distress.  Neurological:     Mental Status: He is alert.  Psychiatric:        Mood and Affect: Mood normal.        Behavior: Behavior normal.        Thought Content: Thought content normal.        Judgment: Judgment normal.     Results for orders placed or performed in visit on 04/05/21  TSH  Result Value Ref Range   TSH 1.780 0.450 - 4.500 uIU/mL  Lipid panel  Result Value Ref Range   Cholesterol, Total 158 100 - 199 mg/dL   Triglycerides 68 0 - 149 mg/dL   HDL 50 >39 mg/dL   VLDL Cholesterol Cal 13 5 - 40 mg/dL   LDL Chol Calc (NIH) 95 0 - 99 mg/dL   Chol/HDL Ratio 3.2 0.0 - 5.0 ratio  CBC with Differential/Platelet  Result Value Ref Range   WBC 4.6 3.4 - 10.8 x10E3/uL   RBC 5.16 4.14 - 5.80 x10E6/uL   Hemoglobin 15.5 13.0 - 17.7 g/dL   Hematocrit 47.0 37.5 - 51.0 %   MCV 91 79 - 97 fL   MCH 30.0 26.6 - 33.0 pg  MCHC 33.0 31.5 - 35.7 g/dL   RDW 12.9 11.6 - 15.4 %   Platelets 223 150 - 450 x10E3/uL   Neutrophils 47 Not Estab. %   Lymphs 41 Not Estab. %   Monocytes 9 Not Estab. %   Eos 2 Not Estab. %   Basos 0 Not Estab. %   Neutrophils Absolute 2.2 1.4 - 7.0 x10E3/uL   Lymphocytes Absolute 1.9 0.7 - 3.1 x10E3/uL   Monocytes Absolute 0.4 0.1 - 0.9 x10E3/uL   EOS (ABSOLUTE) 0.1 0.0 - 0.4 x10E3/uL   Basophils Absolute 0.0 0.0 - 0.2 x10E3/uL   Immature Granulocytes 1 Not Estab. %   Immature Grans (Abs) 0.0 0.0 - 0.1 x10E3/uL  Comprehensive metabolic panel  Result Value Ref Range   Glucose 88 65 - 99 mg/dL   BUN 14 6 - 20 mg/dL   Creatinine, Ser 1.02 0.76 - 1.27 mg/dL   eGFR 105 >59 mL/min/1.73   BUN/Creatinine Ratio 14 9 - 20   Sodium 141 134 - 144 mmol/L   Potassium 3.9 3.5 - 5.2 mmol/L   Chloride 100 96 - 106 mmol/L   CO2 24 20 - 29 mmol/L   Calcium 9.5 8.7 - 10.2 mg/dL   Total Protein 7.0 6.0 - 8.5 g/dL   Albumin 5.0 4.1 - 5.2 g/dL   Globulin, Total 2.0 1.5 - 4.5 g/dL   Albumin/Globulin  Ratio 2.5 (H) 1.2 - 2.2   Bilirubin Total 0.6 0.0 - 1.2 mg/dL   Alkaline Phosphatase 59 44 - 121 IU/L   AST 19 0 - 40 IU/L   ALT 22 0 - 44 IU/L  Urinalysis, Routine w reflex microscopic  Result Value Ref Range   Specific Gravity, UA 1.010 1.005 - 1.030   pH, UA 6.5 5.0 - 7.5   Color, UA Yellow Yellow   Appearance Ur Clear Clear   Leukocytes,UA Negative Negative   Protein,UA Negative Negative/Trace   Glucose, UA Negative Negative   Ketones, UA Negative Negative   RBC, UA Negative Negative   Bilirubin, UA Negative Negative   Urobilinogen, Ur 0.2 0.2 - 1.0 mg/dL   Nitrite, UA Negative Negative  Hepatitis C Antibody  Result Value Ref Range   Hep C Virus Ab <0.1 0.0 - 0.9 s/co ratio  HIV Antibody (routine testing w rflx)  Result Value Ref Range   HIV Screen 4th Generation wRfx Non Reactive Non Reactive  HgB A1c  Result Value Ref Range   Hgb A1c MFr Bld 5.0 4.8 - 5.6 %   Est. average glucose Bld gHb Est-mCnc 97 mg/dL      Assessment & Plan:   Problem List Items Addressed This Visit      Digestive   GERD (gastroesophageal reflux disease)     Other   Depression, major, single episode, mild (HCC)    Chronic.  Ongoing.  Improved from last visit. Will increase Wellbutrin to 326m daily.  Will titrate up slowly with alternating Wellbutrin 2036mone day and 300106mhe next day due to patient's severe side effects the last time medication was increased. Patient understands the plan.  Return in 2 weeks for follow up.       Relevant Medications   buPROPion (WELLBUTRIN XL) 300 MG 24 hr tablet   Anxiety    Chronic.  Ongoing.  Improved from last visit. Will increase Wellbutrin to 300m33mily.  Will titrate up slowly with alternating Wellbutrin 200mg71m day and 300mg 39mnext day due to patient's severe side effects the last  time medication was increased. Patient understands the plan.  Return in 2 weeks for follow up.       Relevant Medications   buPROPion (WELLBUTRIN XL) 300 MG 24 hr  tablet       Follow up plan: Return in about 2 weeks (around 06/04/2021) for Depression/Anxiety FU (virtual).    This visit was completed via MyChart due to the restrictions of the COVID-19 pandemic. All issues as above were discussed and addressed. Physical exam was done as above through visual confirmation on MyChart. If it was felt that the patient should be evaluated in the office, they were directed there. The patient verbally consented to this visit. 1. Location of the patient: Home 2. Location of the provider: Office 3. Those involved with this call:  ? Provider: Jon Billings, NP ? CMA: Yvonna Alanis, CMA ? Front Desk/Registration: [facility] Side 4. Time spent on call: 20 minutes with patient face to face via video conference. More than 50% of this time was spent in counseling and coordination of care. 30 minutes total spent in review of patient's record and preparation of their chart.

## 2021-05-21 NOTE — Telephone Encounter (Signed)
Called pt to collect copay for today's appt no answer left vm  

## 2021-05-22 NOTE — Assessment & Plan Note (Addendum)
Chronic.  Ongoing.  Improved from last visit. Will increase Wellbutrin to 300mg  daily.  Will titrate up slowly with alternating Wellbutrin 200mg  one day and 300mg  the next day due to patient's severe side effects the last time medication was increased. Patient understands the plan.  Return in 2 weeks for follow up.

## 2021-05-22 NOTE — Assessment & Plan Note (Signed)
Chronic.  Ongoing.  Improved from last visit. Will increase Wellbutrin to 300mg daily.  Will titrate up slowly with alternating Wellbutrin 200mg one day and 300mg the next day due to patient's severe side effects the last time medication was increased. Patient understands the plan.  Return in 2 weeks for follow up.  

## 2021-05-22 NOTE — Assessment & Plan Note (Deleted)
Chronic.  Ongoing.  Improved from last visit. Will increase Wellbutrin to 300mg daily.  Will titrate up slowly with alternating Wellbutrin 200mg one day and 300mg the next day due to patient's severe side effects the last time medication was increased. Patient understands the plan.  Return in 2 weeks for follow up.  

## 2021-06-04 ENCOUNTER — Telehealth: Payer: Self-pay

## 2021-06-04 NOTE — Telephone Encounter (Signed)
-----   Message from Larae Grooms, NP sent at 05/22/2021 11:21 AM EDT ----- Can we please schedule patient's follow up? Thank you!

## 2021-06-04 NOTE — Telephone Encounter (Signed)
LVM TO Return in about 2 weeks (around 06/04/2021) for Depression/Anxiety FU (virtual)APT

## 2021-06-06 NOTE — Telephone Encounter (Signed)
LVM TO Return in about 2 weeks (around 06/04/2021) for Depression/Anxiety FU (virtual)APT  

## 2021-06-14 ENCOUNTER — Telehealth: Payer: 59 | Admitting: Nurse Practitioner

## 2021-06-18 ENCOUNTER — Other Ambulatory Visit: Payer: Self-pay | Admitting: Nurse Practitioner

## 2021-06-20 ENCOUNTER — Telehealth (INDEPENDENT_AMBULATORY_CARE_PROVIDER_SITE_OTHER): Payer: 59 | Admitting: Nurse Practitioner

## 2021-06-20 ENCOUNTER — Encounter: Payer: Self-pay | Admitting: Nurse Practitioner

## 2021-06-20 ENCOUNTER — Other Ambulatory Visit: Payer: Self-pay

## 2021-06-20 DIAGNOSIS — F320 Major depressive disorder, single episode, mild: Secondary | ICD-10-CM

## 2021-06-20 DIAGNOSIS — F419 Anxiety disorder, unspecified: Secondary | ICD-10-CM | POA: Diagnosis not present

## 2021-06-20 MED ORDER — BUPROPION HCL ER (XL) 300 MG PO TB24: ORAL_TABLET | ORAL | 1 refills | Status: DC

## 2021-06-20 NOTE — Assessment & Plan Note (Signed)
Chronic.  Well controlled on current medication regimen.  Refill sent today.  Follow up in 3 months for reevaluation. Call sooner if concerns arise.

## 2021-06-20 NOTE — Progress Notes (Signed)
There were no vitals taken for this visit.   Subjective:    Patient ID: Cole Riley, male    DOB: 01-17-1996, 25 y.o.   MRN: [id number]  HPI: Cole Riley is a 25 y.o. male  Chief Complaint  Patient presents with   Depression   Anxiety    DEPRESSION/ANXIETY Patient states he is improving a lot.  His depression and anxiety have improved significantly.  He is having some fatigue but not sure if it medication or some lifestyle changes that he has made.  He tolerated increasing from 236m to 3052mmuch better than the last dose increase. Feels like this is a good dose for him.  Denies SI.  [id number]       GAD 7 : Generalized Anxiety Score 06/20/2021 05/21/2021 05/07/2021 04/05/2021  Nervous, Anxious, on Edge 1 1 3 2   Control/stop worrying 0 1 3 2   Worry too much - different things 0 0 2 1  Trouble relaxing 1 0 2 2  Restless 0 0 0 0  Easily annoyed or irritable 1 2 2 2   Afraid - awful might happen 0 1 1 0  Total GAD 7 Score 3 5 13 9   Anxiety Difficulty Not difficult at all Somewhat difficult Somewhat difficult Somewhat difficult     Relevant past medical, surgical, family and social history reviewed and updated as indicated. Interim medical history since our last visit reviewed. Allergies and medications reviewed and updated.  Review of Systems  Psychiatric/Behavioral:  Positive for dysphoric mood. Negative for suicidal ideas. The patient is nervous/anxious.    Per HPI unless specifically indicated above     Objective:    There were no vitals taken for this visit.  Wt Readings from Last 3 Encounters:  05/07/21 166 lb (75.3 kg)  04/05/21 171 lb 4 oz (77.7 kg)  03/05/21 171 lb 3.2 oz (77.7 kg)    Physical Exam Vitals and nursing note reviewed.  Constitutional:      General: He is not in acute distress.    Appearance: He is not ill-appearing.  HENT:     Head: Normocephalic.      Right Ear: Hearing normal.     Left Ear: Hearing normal.     Nose: Nose normal.  Pulmonary:     Effort: Pulmonary effort is normal. No respiratory distress.  Neurological:     Mental Status: He is alert.  Psychiatric:        Mood and Affect: Mood normal.        Behavior: Behavior normal.        Thought Content: Thought content normal.        Judgment: Judgment normal.    Results for orders placed or performed in visit on 04/05/21  TSH  Result Value Ref Range   TSH 1.780 0.450 - 4.500 uIU/mL  Lipid panel  Result Value Ref Range   Cholesterol, Total 158 100 - 199 mg/dL   Triglycerides 68 0 - 149 mg/dL   HDL 50 >39 mg/dL   VLDL Cholesterol Cal 13 5 - 40 mg/dL   LDL Chol Calc (NIH) 95 0 - 99 mg/dL   Chol/HDL Ratio 3.2 0.0 - 5.0 ratio  CBC with Differential/Platelet  Result Value Ref Range   WBC 4.6 3.4 - 10.8 x10E3/uL   RBC 5.16 4.14 - 5.80 x10E6/uL   Hemoglobin 15.5 13.0 - 17.7 g/dL   Hematocrit 47.0 37.5 -  51.0 %   MCV 91 79 - 97 fL   MCH 30.0 26.6 - 33.0 pg   MCHC 33.0 31.5 - 35.7 g/dL   RDW 12.9 11.6 - 15.4 %   Platelets 223 150 - 450 x10E3/uL   Neutrophils 47 Not Estab. %   Lymphs 41 Not Estab. %   Monocytes 9 Not Estab. %   Eos 2 Not Estab. %   Basos 0 Not Estab. %   Neutrophils Absolute 2.2 1.4 - 7.0 x10E3/uL   Lymphocytes Absolute 1.9 0.7 - 3.1 x10E3/uL   Monocytes Absolute 0.4 0.1 - 0.9 x10E3/uL   EOS (ABSOLUTE) 0.1 0.0 - 0.4 x10E3/uL   Basophils Absolute 0.0 0.0 - 0.2 x10E3/uL   Immature Granulocytes 1 Not Estab. %   Immature Grans (Abs) 0.0 0.0 - 0.1 x10E3/uL  Comprehensive metabolic panel  Result Value Ref Range   Glucose 88 65 - 99 mg/dL   BUN 14 6 - 20 mg/dL   Creatinine, Ser 1.02 0.76 - 1.27 mg/dL   eGFR 105 >59 mL/min/1.73   BUN/Creatinine Ratio 14 9 - 20   Sodium 141 134 - 144 mmol/L   Potassium 3.9 3.5 - 5.2 mmol/L   Chloride 100 96 - 106 mmol/L   CO2 24 20 - 29 mmol/L   Calcium 9.5 8.7 - 10.2 mg/dL   Total Protein 7.0 6.0 - 8.5 g/dL    Albumin 5.0 4.1 - 5.2 g/dL   Globulin, Total 2.0 1.5 - 4.5 g/dL   Albumin/Globulin Ratio 2.5 (H) 1.2 - 2.2   Bilirubin Total 0.6 0.0 - 1.2 mg/dL   Alkaline Phosphatase 59 44 - 121 IU/L   AST 19 0 - 40 IU/L   ALT 22 0 - 44 IU/L  Urinalysis, Routine w reflex microscopic  Result Value Ref Range   Specific Gravity, UA 1.010 1.005 - 1.030   pH, UA 6.5 5.0 - 7.5   Color, UA Yellow Yellow   Appearance Ur Clear Clear   Leukocytes,UA Negative Negative   Protein,UA Negative Negative/Trace   Glucose, UA Negative Negative   Ketones, UA Negative Negative   RBC, UA Negative Negative   Bilirubin, UA Negative Negative   Urobilinogen, Ur 0.2 0.2 - 1.0 mg/dL   Nitrite, UA Negative Negative  Hepatitis C Antibody  Result Value Ref Range   Hep C Virus Ab <0.1 0.0 - 0.9 s/co ratio  HIV Antibody (routine testing w rflx)  Result Value Ref Range   HIV Screen 4th Generation wRfx Non Reactive Non Reactive  HgB A1c  Result Value Ref Range   Hgb A1c MFr Bld 5.0 4.8 - 5.6 %   Est. average glucose Bld gHb Est-mCnc 97 mg/dL      Assessment & Plan:   Problem List Items Addressed This Visit       Other   Depression, major, single episode, mild (HCC)    Chronic.  Well controlled on current medication regimen.  Refill sent today.  Follow up in 3 months for reevaluation. Call sooner if concerns arise.        Relevant Medications   buPROPion (WELLBUTRIN XL) 300 MG 24 hr tablet   Anxiety - Primary    Chronic.  Well controlled on current medication regimen.  Refill sent today.  Follow up in 3 months for reevaluation. Call sooner if concerns arise.        Relevant Medications   buPROPion (WELLBUTRIN XL) 300 MG 24 hr tablet     Follow up plan: Return in about  3 months (around 09/20/2021) for Depression/Anxiety FU.   This visit was completed via MyChart due to the restrictions of the COVID-19 pandemic. All issues as above were discussed and addressed. Physical exam was done as above through visual  confirmation on MyChart. If it was felt that the patient should be evaluated in the office, they were directed there. The patient verbally consented to this visit. Location of the patient: Home Location of the provider: Office Those involved with this call:  Provider: Jon Billings, NP CMA: Yvonna Alanis, [place] Desk/Registration: [facility] Side Time spent on call: 15 minutes with patient face to face via video conference. More than 50% of this time was spent in counseling and coordination of care. 20 minutes total spent in review of patient's record and preparation of their chart.

## 2021-06-20 NOTE — Assessment & Plan Note (Signed)
Chronic.  Well controlled on current medication regimen.  Refill sent today.  Follow up in 3 months for reevaluation. Call sooner if concerns arise.  

## 2021-06-22 ENCOUNTER — Telehealth: Payer: Self-pay

## 2021-06-22 NOTE — Telephone Encounter (Signed)
-----   Message from Larae Grooms, NP sent at 06/20/2021  8:53 AM EDT ----- Can we schedule his follow up?

## 2021-06-22 NOTE — Telephone Encounter (Signed)
Lvm Return in about 3 months (around 09/20/2021) for Depression/Anxiety FU.To make this apt

## 2021-06-25 NOTE — Telephone Encounter (Signed)
Lvm LVM TO Return in about 2 weeks (around 06/04/2021) for Depression/Anxiety FU (virtual)APT

## 2021-08-21 NOTE — Progress Notes (Signed)
There were no vitals taken for this visit.   Subjective:    Patient ID: Cole Riley, male    DOB: Apr 24, 1996, 25 y.o.   MRN: [id number]  HPI: Cole Riley is a 25 y.o. male  Chief Complaint  Patient presents with   Anxiety   Depression   Medication Problem    Patient states that he is having some side effects from his medication 391m bupropion, having anxiety nausea, lightheadedness and up set stomach.   ANXIETY/DEPRESSION Patient states the Wellbutrin is causing some worsening anxiety and nausea, lightheadedness and upset stomach.  Denies SI. Would like to discuss possibly changing medications. The Wellbutrin has helped his depression a lot but the anxiety is still persistent.  GAD 7 : Generalized Anxiety Score 06/20/2021 05/21/2021 05/07/2021 04/05/2021  Nervous, Anxious, on Edge 1 1 3 2   Control/stop worrying 0 1 3 2   Worry too much - different things 0 0 2 1  Trouble relaxing 1 0 2 2  Restless 0 0 0 0  Easily annoyed or irritable 1 2 2 2   Afraid - awful might happen 0 1 1 0  Total GAD 7 Score 3 5 13 9   Anxiety Difficulty Not difficult at all Somewhat difficult Somewhat difficult Somewhat difficult    Flowsheet Row Video Visit [place]/20[place] in [facility] PHQ-9 Total Score 3        Relevant past medical, surgical, family and social history reviewed and updated as indicated. Interim medical history since our last visit reviewed. Allergies and medications reviewed and updated.  Review of Systems  Gastrointestinal:  Positive for nausea.  Psychiatric/Behavioral:  Negative for dysphoric mood and suicidal ideas. The patient is nervous/anxious.    Per HPI unless specifically indicated above     Objective:    There were no vitals taken for this visit.  Wt Readings from Last 3 Encounters:  05/07/21 166 lb (75.3 kg)  04/05/21 171 lb 4 oz (77.7 kg)  03/05/21 171 lb 3.2 oz (77.7 kg)    Physical Exam Vitals and nursing note reviewed.   Constitutional:      General: He is not in acute distress.    Appearance: He is not ill-appearing.  HENT:     Head: Normocephalic.     Right Ear: Hearing normal.     Left Ear: Hearing normal.     Nose: Nose normal.  Pulmonary:     Effort: Pulmonary effort is normal. No respiratory distress.  Neurological:     Mental Status: He is alert.  Psychiatric:        Mood and Affect: Mood normal.        Behavior: Behavior normal.        Thought Content: Thought content normal.        Judgment: Judgment normal.    Results for orders placed or performed in visit on 04/05/21  TSH  Result Value Ref Range   TSH 1.780 0.450 - 4.500 uIU/mL  Lipid panel  Result Value Ref Range   Cholesterol, Total 158 100 - 199 mg/dL   Triglycerides 68 0 - 149 mg/dL   HDL 50 >39 mg/dL   VLDL Cholesterol Cal 13 5 - 40 mg/dL   LDL Chol Calc (NIH) 95 0 - 99 mg/dL   Chol/HDL Ratio 3.2 0.0 - 5.0 ratio  CBC with Differential/Platelet  Result Value Ref Range   WBC 4.6 3.4 - 10.8 x10E3/uL   RBC 5.16 4.14 - 5.80 x10E6/uL   Hemoglobin  15.5 13.0 - 17.7 g/dL   Hematocrit 47.0 37.5 - 51.0 %   MCV 91 79 - 97 fL   MCH 30.0 26.6 - 33.0 pg   MCHC 33.0 31.5 - 35.7 g/dL   RDW 12.9 11.6 - 15.4 %   Platelets 223 150 - 450 x10E3/uL   Neutrophils 47 Not Estab. %   Lymphs 41 Not Estab. %   Monocytes 9 Not Estab. %   Eos 2 Not Estab. %   Basos 0 Not Estab. %   Neutrophils Absolute 2.2 1.4 - 7.0 x10E3/uL   Lymphocytes Absolute 1.9 0.7 - 3.1 x10E3/uL   Monocytes Absolute 0.4 0.1 - 0.9 x10E3/uL   EOS (ABSOLUTE) 0.1 0.0 - 0.4 x10E3/uL   Basophils Absolute 0.0 0.0 - 0.2 x10E3/uL   Immature Granulocytes 1 Not Estab. %   Immature Grans (Abs) 0.0 0.0 - 0.1 x10E3/uL  Comprehensive metabolic panel  Result Value Ref Range   Glucose 88 65 - 99 mg/dL   BUN 14 6 - 20 mg/dL   Creatinine, Ser 1.02 0.76 - 1.27 mg/dL   eGFR 105 >59 mL/min/1.73   BUN/Creatinine Ratio 14 9 - 20   Sodium 141 134 - 144 mmol/L   Potassium 3.9 3.5 -  5.2 mmol/L   Chloride 100 96 - 106 mmol/L   CO2 24 20 - 29 mmol/L   Calcium 9.5 8.7 - 10.2 mg/dL   Total Protein 7.0 6.0 - 8.5 g/dL   Albumin 5.0 4.1 - 5.2 g/dL   Globulin, Total 2.0 1.5 - 4.5 g/dL   Albumin/Globulin Ratio 2.5 (H) 1.2 - 2.2   Bilirubin Total 0.6 0.0 - 1.2 mg/dL   Alkaline Phosphatase 59 44 - 121 IU/L   AST 19 0 - 40 IU/L   ALT 22 0 - 44 IU/L  Urinalysis, Routine w reflex microscopic  Result Value Ref Range   Specific Gravity, UA 1.010 1.005 - 1.030   pH, UA 6.5 5.0 - 7.5   Color, UA Yellow Yellow   Appearance Ur Clear Clear   Leukocytes,UA Negative Negative   Protein,UA Negative Negative/Trace   Glucose, UA Negative Negative   Ketones, UA Negative Negative   RBC, UA Negative Negative   Bilirubin, UA Negative Negative   Urobilinogen, Ur 0.2 0.2 - 1.0 mg/dL   Nitrite, UA Negative Negative  Hepatitis C Antibody  Result Value Ref Range   Hep C Virus Ab <0.1 0.0 - 0.9 s/co ratio  HIV Antibody (routine testing w rflx)  Result Value Ref Range   HIV Screen 4th Generation wRfx Non Reactive Non Reactive  HgB A1c  Result Value Ref Range   Hgb A1c MFr Bld 5.0 4.8 - 5.6 %   Est. average glucose Bld gHb Est-mCnc 97 mg/dL      Assessment & Plan:   Problem List Items Addressed This Visit       Other   Depression, major, single episode, mild (HCC) - Primary    Chronic.  Controlled with Wellbutrin 338m daily.  However, patient is having side effects and worsening anxiety with the medication.  Will change to Effexor 37.564mdaily. Once patient has been on the medication will wean down on the Wellbutrin.  Discussed side effects and benefits of Effexor with patient during visit today. Follow up in 2 weeks for reevaluation.       Relevant Medications   venlafaxine XR (EFFEXOR XR) 37.5 MG 24 hr capsule   Anxiety    Chronic.  Uncontrolled. Will start Effexor 37.5 daily.  Discussed side effects and benefits of medication with patient during visit. Will plan to wean down on  Wellbutrin as Effexor becomes therapeutic.  Follow up in 2 weeks for reevaluation.       Relevant Medications   venlafaxine XR (EFFEXOR XR) 37.5 MG 24 hr capsule     Follow up plan: Return in about 2 months (around 10/22/2021) for Depression/Anxiety FU (virtual).   This visit was completed via MyChart due to the restrictions of the COVID-19 pandemic. All issues as above were discussed and addressed. Physical exam was done as above through visual confirmation on MyChart. If it was felt that the patient should be evaluated in the office, they were directed there. The patient verbally consented to this visit. Location of the patient: Home Location of the provider: Office Those involved with this call:  Provider: Jon Billings, NP CMA: Jodie Echevaria, CMA Front Desk/Registration: Elizabeth Palau, CMA This encounter was conducted via video.  I spent 20 dedicated to the care of this patient on the date of this encounter to include previsit review of 30, face to face time with the patient, and post visit ordering of testing.

## 2021-08-22 ENCOUNTER — Telehealth (INDEPENDENT_AMBULATORY_CARE_PROVIDER_SITE_OTHER): Payer: 59 | Admitting: Nurse Practitioner

## 2021-08-22 DIAGNOSIS — F320 Major depressive disorder, single episode, mild: Secondary | ICD-10-CM | POA: Diagnosis not present

## 2021-08-22 DIAGNOSIS — F419 Anxiety disorder, unspecified: Secondary | ICD-10-CM | POA: Diagnosis not present

## 2021-08-22 MED ORDER — VENLAFAXINE HCL ER 37.5 MG PO CP24: 37.5000 mg | ORAL_CAPSULE | Freq: Every day | ORAL | 0 refills | Status: DC

## 2021-08-22 NOTE — Assessment & Plan Note (Signed)
Chronic.  Controlled with Wellbutrin 300mg  daily.  However, patient is having side effects and worsening anxiety with the medication.  Will change to Effexor 37.5mg  daily. Once patient has been on the medication will wean down on the Wellbutrin.  Discussed side effects and benefits of Effexor with patient during visit today. Follow up in 2 weeks for reevaluation.

## 2021-08-22 NOTE — Assessment & Plan Note (Signed)
Chronic.  Uncontrolled. Will start Effexor 37.5 daily. Discussed side effects and benefits of medication with patient during visit. Will plan to wean down on Wellbutrin as Effexor becomes therapeutic.  Follow up in 2 weeks for reevaluation.

## 2021-09-17 MED ORDER — BUPROPION HCL ER (XL) 150 MG PO TB24: 150.0000 mg | ORAL_TABLET | Freq: Every day | ORAL | 0 refills | Status: DC

## 2021-09-25 ENCOUNTER — Other Ambulatory Visit: Payer: Self-pay | Admitting: Nurse Practitioner

## 2021-09-25 DIAGNOSIS — F419 Anxiety disorder, unspecified: Secondary | ICD-10-CM

## 2021-09-25 DIAGNOSIS — F320 Major depressive disorder, single episode, mild: Secondary | ICD-10-CM

## 2021-09-25 NOTE — Telephone Encounter (Signed)
Requested medication (s) are due for refill today:yes  Requested medication (s) are on the active medication list:yes    Last refill: 08/22/21  #30  0 refills  Future visit scheduled yes 10/24  Notes to clinic: OV note reads F/U in 2 weeks from appt 08/22/21. Pt's appt 10/22/21. Please review.  Requested Prescriptions  Pending Prescriptions Disp Refills   venlafaxine XR (EFFEXOR-XR) 37.5 MG 24 hr capsule [Pharmacy Med Name: VENLAFAXINE ER 37.5MG  CAPSULES] 30 capsule 0    Sig: TAKE 1 CAPSULE(37.5 MG) BY MOUTH DAILY WITH BREAKFAST     Psychiatry: Antidepressants - SNRI - desvenlafaxine & venlafaxine Passed - 09/25/2021  3:14 AM      Passed - LDL in normal range and within 360 days    LDL Chol Calc (NIH)  Date Value Ref Range Status  04/05/2021 95 0 - 99 mg/dL Final          Passed - Total Cholesterol in normal range and within 360 days    Cholesterol, Total  Date Value Ref Range Status  04/05/2021 158 100 - 199 mg/dL Final   Cholesterol Piccolo, Waived  Date Value Ref Range Status  07/14/2015 120 <200 mg/dL Final    Comment:                            Desirable                <200                         Borderline High      200- 239                         High                     >239           Passed - Triglycerides in normal range and within 360 days    Triglycerides  Date Value Ref Range Status  04/05/2021 68 0 - 149 mg/dL Final   Triglycerides Piccolo,Waived  Date Value Ref Range Status  07/14/2015 54 <150 mg/dL Final    Comment:                            Normal                   <150                         Borderline High     150 - 199                         High                200 - 499                         Very High                >499           Passed - Completed PHQ-2 or PHQ-9 in the last 360 days      Passed - Last BP in normal range    BP Readings from Last 1 Encounters:  05/07/21 110/66  Passed - Valid encounter within last 6  months    Recent Outpatient Visits           1 month ago Depression, major, single episode, mild (HCC)   [facility] Larae Grooms, NP   3 months ago Anxiety   [facility], Clydie Braun, NP   4 months ago Depression, major, single episode, mild (HCC)   [facility] Larae Grooms, NP   4 months ago Depression, major, single episode, mild (HCC)   [facility] Larae Grooms, NP   5 months ago Annual physical exam   [facility] Larae Grooms, NP       Future Appointments             In 3 weeks Larae Grooms, NP [facility]

## 2021-09-28 ENCOUNTER — Ambulatory Visit: Payer: Self-pay

## 2021-09-28 NOTE — Telephone Encounter (Signed)
Pt. Reports he has GERD and for 1 year has had upper abdominal pain in the middle. Discomfort getting worse. Has mainly when he has an empty stomach. Has an appointment Monday.    Answer Assessment - Initial Assessment Questions 1. LOCATION: "Where does it hurt?"      Upper abdomen 2. RADIATION: "Does the pain shoot anywhere else?" (e.g., chest, back)     No 3. ONSET: "When did the pain begin?" (Minutes, hours or days ago)      X 1 year but getting worse x 1 month 4. SUDDEN: "Gradual or sudden onset?"     Gradual 5. PATTERN "Does the pain come and go, or is it constant?"    - If constant: "Is it getting better, staying the same, or worsening?"      (Note: Constant means the pain never goes away completely; most serious pain is constant and it progresses)     - If intermittent: "How long does it last?" "Do you have pain now?"     (Note: Intermittent means the pain goes away completely between bouts)     Comes and goes 6. SEVERITY: "How bad is the pain?"  (e.g., Scale 1-10; mild, moderate, or severe)    - MILD (1-3): doesn't interfere with normal activities, abdomen soft and not tender to touch     - MODERATE (4-7): interferes with normal activities or awakens from sleep, abdomen tender to touch     - SEVERE (8-10): excruciating pain, doubled over, unable to do any normal activities       5 at it's worse 7. RECURRENT SYMPTOM: "Have you ever had this type of stomach pain before?" If Yes, ask: "When was the last time?" and "What happened that time?"      Yes 8. CAUSE: "What do you think is causing the stomach pain?"     GERD 9. RELIEVING/AGGRAVATING FACTORS: "What makes it better or worse?" (e.g., movement, antacids, bowel movement)     nO 10. OTHER SYMPTOMS: "Do you have any other symptoms?" (e.g., back pain, diarrhea, fever, urination pain, vomiting)       nO  Protocols used: Abdominal Pain - Male-A-AH

## 2021-10-01 ENCOUNTER — Ambulatory Visit: Payer: 59 | Admitting: Nurse Practitioner

## 2021-10-01 ENCOUNTER — Encounter: Payer: Self-pay | Admitting: Nurse Practitioner

## 2021-10-01 ENCOUNTER — Other Ambulatory Visit: Payer: Self-pay

## 2021-10-01 VITALS — BP 132/78 | HR 78 | Ht 71.0 in | Wt 166.0 lb

## 2021-10-01 DIAGNOSIS — K219 Gastro-esophageal reflux disease without esophagitis: Secondary | ICD-10-CM | POA: Diagnosis not present

## 2021-10-01 DIAGNOSIS — F320 Major depressive disorder, single episode, mild: Secondary | ICD-10-CM

## 2021-10-01 DIAGNOSIS — F419 Anxiety disorder, unspecified: Secondary | ICD-10-CM

## 2021-10-01 MED ORDER — OMEPRAZOLE 20 MG PO CPDR: 20.0000 mg | DELAYED_RELEASE_CAPSULE | Freq: Every day | ORAL | 1 refills | Status: DC

## 2021-10-01 NOTE — Assessment & Plan Note (Signed)
Chronic.  Controlled.  Continue with current medication regimen on Wellbutrin 150mg daily.  Keep scheduled follow up appointment.  Call sooner if concerns arise.   

## 2021-10-01 NOTE — Assessment & Plan Note (Signed)
Chronic. Not well controlled. Will start Omeprazole 20mg  daily. Will obtain H. Pylori test.  Will also refer patient to GI for further evaluation and management.

## 2021-10-01 NOTE — Assessment & Plan Note (Signed)
Chronic.  Controlled.  Continue with current medication regimen on Wellbutrin 150mg  daily.  Keep scheduled follow up appointment.  Call sooner if concerns arise.

## 2021-10-01 NOTE — Progress Notes (Signed)
BP 132/78   Pulse 78   Ht _0  (1.803 m)   Wt 166 lb (75.3 kg)   BMI 23.15 kg/m    Subjective:    Patient ID: Cole Riley, male    DOB: 02/27/1996, 25 y.o.   MRN: [id number]  HPI: Cole Riley is a 25 y.o. male  Chief Complaint  Patient presents with   Abdominal Pain   Gastroesophageal Reflux   GERD Patient states for a little while now he has been having epigastric abdominal discomfort.  Over the past month-month and a half it has gotten worse.  States it is very uncomfortable.  Feels it mostly on an empty stomach and when he eats something really heavy he feels the pain.  GERD control status: uncontrolled Satisfied with current treatment?  Not currently taking Heartburn frequency:  Medication side effects: no  Medication compliance: worse Previous GERD medications: Antacid use frequency:   Duration:  Nature:  Location:  Heartburn duration:  Alleviatiating factors:   Aggravating factors: eating late, empty stomach  DEPRESSION/ANXIETY Patient states he is not taking Effexor due to side effects that he had with the medication.  He feels like the Wellbutrin 133m is working to help control his symptoms.  Does not feel like he needs to add medication at this time. Denies SI.  Relevant past medical, surgical, family and social history reviewed and updated as indicated. Interim medical history since our last visit reviewed. Allergies and medications reviewed and updated.  Review of Systems  Gastrointestinal:  Positive for abdominal pain.       GERD  Psychiatric/Behavioral:  Negative for dysphoric mood and suicidal ideas. The patient is not nervous/anxious.    Per HPI unless specifically indicated above     Objective:    BP 132/78   Pulse 78   Ht _1  (1.803 m)   Wt 166 lb (75.3 kg)   BMI 23.15 kg/m   Wt Readings from Last 3 Encounters:  10/01/21 166 lb (75.3 kg)  05/07/21 166 lb (75.3 kg)  04/05/21 171 lb 4 oz (77.7 kg)    Physical Exam Vitals  and nursing note reviewed.  Constitutional:      General: He is not in acute distress.    Appearance: Normal appearance. He is not ill-appearing, toxic-appearing or diaphoretic.  HENT:     Head: Normocephalic.     Right Ear: External ear normal.     Left Ear: External ear normal.     Nose: Nose normal. No congestion or rhinorrhea.     Mouth/Throat:     Mouth: Mucous membranes are moist.  Eyes:     General:        Right eye: No discharge.        Left eye: No discharge.     Extraocular Movements: Extraocular movements intact.     Conjunctiva/sclera: Conjunctivae normal.     Pupils: Pupils are equal, round, and reactive to light.  Cardiovascular:     Rate and Rhythm: Normal rate and regular rhythm.     Heart sounds: No murmur heard. Pulmonary:     Effort: Pulmonary effort is normal. No respiratory distress.     Breath sounds: Normal breath sounds. No wheezing, rhonchi or rales.  Abdominal:     General: Abdomen is flat. Bowel sounds are normal.  Musculoskeletal:     Cervical back: Normal range of motion and neck supple.  Skin:    General: Skin is warm and dry.  Capillary Refill: Capillary refill takes less than 2 seconds.  Neurological:     General: No focal deficit present.     Mental Status: He is alert and oriented to person, place, and time.  Psychiatric:        Mood and Affect: Mood normal.        Behavior: Behavior normal.        Thought Content: Thought content normal.        Judgment: Judgment normal.    Results for orders placed or performed in visit on 04/05/21  TSH  Result Value Ref Range   TSH 1.780 0.450 - 4.500 uIU/mL  Lipid panel  Result Value Ref Range   Cholesterol, Total 158 100 - 199 mg/dL   Triglycerides 68 0 - 149 mg/dL   HDL 50 >39 mg/dL   VLDL Cholesterol Cal 13 5 - 40 mg/dL   LDL Chol Calc (NIH) 95 0 - 99 mg/dL   Chol/HDL Ratio 3.2 0.0 - 5.0 ratio  CBC with Differential/Platelet  Result Value Ref Range   WBC 4.6 3.4 - 10.8 x10E3/uL   RBC  5.16 4.14 - 5.80 x10E6/uL   Hemoglobin 15.5 13.0 - 17.7 g/dL   Hematocrit 47.0 37.5 - 51.0 %   MCV 91 79 - 97 fL   MCH 30.0 26.6 - 33.0 pg   MCHC 33.0 31.5 - 35.7 g/dL   RDW 12.9 11.6 - 15.4 %   Platelets 223 150 - 450 x10E3/uL   Neutrophils 47 Not Estab. %   Lymphs 41 Not Estab. %   Monocytes 9 Not Estab. %   Eos 2 Not Estab. %   Basos 0 Not Estab. %   Neutrophils Absolute 2.2 1.4 - 7.0 x10E3/uL   Lymphocytes Absolute 1.9 0.7 - 3.1 x10E3/uL   Monocytes Absolute 0.4 0.1 - 0.9 x10E3/uL   EOS (ABSOLUTE) 0.1 0.0 - 0.4 x10E3/uL   Basophils Absolute 0.0 0.0 - 0.2 x10E3/uL   Immature Granulocytes 1 Not Estab. %   Immature Grans (Abs) 0.0 0.0 - 0.1 x10E3/uL  Comprehensive metabolic panel  Result Value Ref Range   Glucose 88 65 - 99 mg/dL   BUN 14 6 - 20 mg/dL   Creatinine, Ser 1.02 0.76 - 1.27 mg/dL   eGFR 105 >59 mL/min/1.73   BUN/Creatinine Ratio 14 9 - 20   Sodium 141 134 - 144 mmol/L   Potassium 3.9 3.5 - 5.2 mmol/L   Chloride 100 96 - 106 mmol/L   CO2 24 20 - 29 mmol/L   Calcium 9.5 8.7 - 10.2 mg/dL   Total Protein 7.0 6.0 - 8.5 g/dL   Albumin 5.0 4.1 - 5.2 g/dL   Globulin, Total 2.0 1.5 - 4.5 g/dL   Albumin/Globulin Ratio 2.5 (H) 1.2 - 2.2   Bilirubin Total 0.6 0.0 - 1.2 mg/dL   Alkaline Phosphatase 59 44 - 121 IU/L   AST 19 0 - 40 IU/L   ALT 22 0 - 44 IU/L  Urinalysis, Routine w reflex microscopic  Result Value Ref Range   Specific Gravity, UA 1.010 1.005 - 1.030   pH, UA 6.5 5.0 - 7.5   Color, UA Yellow Yellow   Appearance Ur Clear Clear   Leukocytes,UA Negative Negative   Protein,UA Negative Negative/Trace   Glucose, UA Negative Negative   Ketones, UA Negative Negative   RBC, UA Negative Negative   Bilirubin, UA Negative Negative   Urobilinogen, Ur 0.2 0.2 - 1.0 mg/dL   Nitrite, UA Negative Negative  Hepatitis C  Antibody  Result Value Ref Range   Hep C Virus Ab <0.1 0.0 - 0.9 s/co ratio  HIV Antibody (routine testing w rflx)  Result Value Ref Range   HIV  Screen 4th Generation wRfx Non Reactive Non Reactive  HgB A1c  Result Value Ref Range   Hgb A1c MFr Bld 5.0 4.8 - 5.6 %   Est. average glucose Bld gHb Est-mCnc 97 mg/dL      Assessment & Plan:   Problem List Items Addressed This Visit       Digestive   GERD (gastroesophageal reflux disease) - Primary    Chronic. Not well controlled. Will start Omeprazole 26m daily. Will obtain H. Pylori test.  Will also refer patient to GI for further evaluation and management.      Relevant Medications   omeprazole (PRILOSEC) 20 MG capsule   Other Relevant Orders   H. pylori antigen, stool   Ambulatory referral to Gastroenterology     Other   Depression, major, single episode, mild (HCC)    Chronic.  Controlled.  Continue with current medication regimen on Wellbutrin 159mdaily.  Keep scheduled follow up appointment.  Call sooner if concerns arise.        Anxiety    Chronic.  Controlled.  Continue with current medication regimen on Wellbutrin 15016maily.  Keep scheduled follow up appointment.  Call sooner if concerns arise.         Follow up plan: Return if symptoms worsen or fail to improve, for Keep regularly scheduled follow up.

## 2021-10-02 ENCOUNTER — Encounter: Payer: Self-pay | Admitting: *Deleted

## 2021-10-04 LAB — H. PYLORI ANTIGEN, STOOL: H pylori Ag, Stl: NEGATIVE

## 2021-10-04 NOTE — Progress Notes (Signed)
Hi [place].  Your H. Pylori is negative.  Let's see the GI doctor and find out what is going on.  See you soon.

## 2021-10-22 ENCOUNTER — Ambulatory Visit: Payer: 59 | Admitting: Nurse Practitioner

## 2021-10-22 ENCOUNTER — Other Ambulatory Visit: Payer: Self-pay

## 2021-10-22 ENCOUNTER — Encounter: Payer: Self-pay | Admitting: Nurse Practitioner

## 2021-10-22 VITALS — BP 104/75 | HR 77 | Temp 98.3°F | Ht 72.0 in | Wt 155.0 lb

## 2021-10-22 DIAGNOSIS — F320 Major depressive disorder, single episode, mild: Secondary | ICD-10-CM | POA: Diagnosis not present

## 2021-10-22 DIAGNOSIS — F419 Anxiety disorder, unspecified: Secondary | ICD-10-CM | POA: Diagnosis not present

## 2021-10-22 MED ORDER — BUPROPION HCL ER (XL) 150 MG PO TB24: 150.0000 mg | ORAL_TABLET | Freq: Every day | ORAL | 1 refills | Status: DC

## 2021-10-22 NOTE — Assessment & Plan Note (Signed)
Chronic.  Controlled.  Continue with current medication regimen on Wellbutrin 150mg . Refill sent today.  Patient would also like to see therapist.  Referral placed. Return to clinic in 6 months for reevaluation.  Call sooner if concerns arise.

## 2021-10-22 NOTE — Assessment & Plan Note (Signed)
Chronic.  Controlled.  Continue with current medication regimen on Wellbutrin 150mg. Refill sent today.  Patient would also like to see therapist.  Referral placed. Return to clinic in 6 months for reevaluation.  Call sooner if concerns arise.   

## 2021-10-22 NOTE — Progress Notes (Signed)
BP 104/75 (BP Location: Left Arm, Patient Position: Sitting)   Pulse 77   Temp 98.3 F (36.8 C) (Oral)   Ht 6' (1.829 m)   Wt 155 lb (70.3 kg)   SpO2 97%   BMI 21.02 kg/m    Subjective:    Patient ID: Cole Riley, male    DOB: [birth year], 25 y.o.   MRN: [id number]  HPI: Cole Riley is a 25 y.o. male  Chief Complaint  Patient presents with   Follow-up    Med check   ANXIETY/DEPRESSION Patient states everything feels more manageable.  He feels like Wellbutrin 150mg . Denies SI.  He feels like a much better place than he was before.    Flowsheet Row Office Visit from 10/22/2021 in [facility]  PHQ-9 Total Score 6      GAD 7 : Generalized Anxiety Score 10/22/2021 06/20/2021 05/21/2021 05/07/2021  Nervous, Anxious, on Edge 1 1 1 3   Control/stop worrying 1 0 1 3  Worry too much - different things 1 0 0 2  Trouble relaxing 0 1 0 2  Restless 0 0 0 0  Easily annoyed or irritable 1 1 2 2   Afraid - awful might happen 0 0 1 1  Total GAD 7 Score 4 3 5 13   Anxiety Difficulty Not difficult at all Not difficult at all Somewhat difficult Somewhat difficult     Relevant past medical, surgical, family and social history reviewed and updated as indicated. Interim medical history since our last visit reviewed. Allergies and medications reviewed and updated.  Review of Systems  Psychiatric/Behavioral:  Positive for dysphoric mood. Negative for suicidal ideas. The patient is nervous/anxious.    Per HPI unless specifically indicated above     Objective:    BP 104/75 (BP Location: Left Arm, Patient Position: Sitting)   Pulse 77   Temp 98.3 F (36.8 C) (Oral)   Ht 6' (1.829 m)   Wt 155 lb (70.3 kg)   SpO2 97%   BMI 21.02 kg/m   Wt Readings from Last 3 Encounters:  10/22/21 155 lb (70.3 kg)  10/01/21 166 lb (75.3 kg)  05/07/21 166 lb (75.3 kg)    Physical Exam Vitals and nursing note reviewed.  Constitutional:      General: He is not in acute  distress.    Appearance: Normal appearance. He is not ill-appearing, toxic-appearing or diaphoretic.  HENT:     Head: Normocephalic.     Right Ear: External ear normal.     Left Ear: External ear normal.     Nose: Nose normal. No congestion or rhinorrhea.     Mouth/Throat:     Mouth: Mucous membranes are moist.  Eyes:     General:        Right eye: No discharge.        Left eye: No discharge.     Extraocular Movements: Extraocular movements intact.     Conjunctiva/sclera: Conjunctivae normal.     Pupils: Pupils are equal, round, and reactive to light.  Cardiovascular:     Rate and Rhythm: Normal rate and regular rhythm.     Heart sounds: No murmur heard. Pulmonary:     Effort: Pulmonary effort is normal. No respiratory distress.     Breath sounds: Normal breath sounds. No wheezing, rhonchi or rales.  Abdominal:     General: Abdomen is flat. Bowel sounds are normal.  Musculoskeletal:     Cervical back: Normal range of motion and neck supple.  Skin:    General: Skin is warm and dry.     Capillary Refill: Capillary refill takes less than 2 seconds.  Neurological:     General: No focal deficit present.     Mental Status: He is alert and oriented to person, place, and time.  Psychiatric:        Mood and Affect: Mood normal.        Behavior: Behavior normal.        Thought Content: Thought content normal.        Judgment: Judgment normal.    Results for orders placed or performed in visit on 10/01/21  H. pylori antigen, stool  Result Value Ref Range   H pylori Ag, Stl Negative Negative      Assessment & Plan:   Problem List Items Addressed This Visit       Other   Depression, major, single episode, mild (HCC) - Primary    Chronic.  Controlled.  Continue with current medication regimen on Wellbutrin 150mg . Refill sent today.  Patient would also like to see therapist.  Referral placed. Return to clinic in 6 months for reevaluation.  Call sooner if concerns arise.         Relevant Medications   buPROPion (WELLBUTRIN XL) 150 MG 24 hr tablet   Other Relevant Orders   Ambulatory referral to Psychology   Anxiety    Chronic.  Controlled.  Continue with current medication regimen on Wellbutrin 150mg . Refill sent today.  Patient would also like to see therapist.  Referral placed. Return to clinic in 6 months for reevaluation.  Call sooner if concerns arise.        Relevant Medications   buPROPion (WELLBUTRIN XL) 150 MG 24 hr tablet   Other Relevant Orders   Ambulatory referral to Psychology     Follow up plan: Return in about 6 months (around 04/22/2022) for Depression/Anxiety FU.

## 2021-10-29 ENCOUNTER — Encounter: Payer: Self-pay | Admitting: Psychology

## 2021-11-16 ENCOUNTER — Ambulatory Visit: Payer: 59 | Admitting: Gastroenterology

## 2021-11-16 ENCOUNTER — Other Ambulatory Visit: Payer: Self-pay

## 2021-11-16 ENCOUNTER — Encounter: Payer: Self-pay | Admitting: Gastroenterology

## 2021-11-16 VITALS — BP 133/79 | HR 74 | Temp 97.5°F | Ht 72.0 in | Wt 160.0 lb

## 2021-11-16 DIAGNOSIS — K30 Functional dyspepsia: Secondary | ICD-10-CM | POA: Diagnosis not present

## 2021-11-16 NOTE — Progress Notes (Signed)
Arlyss Repress, MD [address]  [address]  [place], [place] [address]  Main: [phone]  Fax: [phone]    Gastroenterology Consultation  Referring Provider:     Larae Grooms, NP Primary Care Physician:  Larae Grooms, NP Primary Gastroenterologist:  Dr. Arlyss Repress Reason for Consultation:     Epigastric discomfort        HPI:   Cole Riley is a 25 y.o. male referred by Dr. Larae Grooms, NP  for consultation & management of epigastric discomfort.  Patient reports approximately 3 months history of epigastric discomfort associated with abdominal bloating and sometimes heartburn.  He reports that he has been having the symptoms for a long time, however, noticing that they are more frequent lately.  He has history of anxiety/depression which is under control on Wellbutrin.  He is also gaining weight.  He is able to finish his meal even though he is experiencing the symptoms.  Sometimes, after a meal he notices some discomfort.  He also noticed that stress certainly makes his symptoms worse.  He thinks he has mild lactose intolerance.  He also noticed that his stools turn more loose, greasy after consumption of fatty foods, greasy foods.  He was taking omeprazole 20 mg for a long time for reflux and he discontinued it.  His labs are unrevealing.  H. 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 (Patient not taking: Reported on 11/16/2021), Disp: 90 capsule, Rfl: 1    Family History  Problem Relation Age of Onset   Sarcoidosis Father    Diabetes Maternal Grandmother    Hypertension Maternal Grandmother    Heart disease Paternal Grandmother    Colon cancer Paternal Grandfather    Cancer Paternal Grandfather        brain   Hyperlipidemia Maternal Grandfather      Social History   Tobacco Use   Smoking status: Never   Smokeless tobacco: Never  Vaping Use   Vaping Use: Never used  Substance Use Topics   Alcohol use: Yes    Alcohol/week: 2.0 standard drinks    Types: 2 Cans of beer per week    Comment:  2 to 3 beers weekly   Drug use: No    Allergies as of 11/16/2021   (No Known Allergies)    Review of Systems:    All systems reviewed and negative except where noted in HPI.   Physical Exam:  BP 133/79 (BP Location: Left Arm, Patient Position: Sitting, Cuff Size: Normal)   Pulse 74   Temp (!) 97.5 F (36.4 C) (Temporal)   Ht 6' (1.829 m)   Wt 160 lb (72.6 kg)   BMI 21.70 kg/m  No LMP for male patient.  General:   Alert,  Well-developed, well-nourished, pleasant and cooperative in NAD Head:  Normocephalic and atraumatic. Eyes:  Sclera clear, no icterus.   Conjunctiva pink. Ears:  Normal auditory acuity. Nose:  No deformity, discharge, or lesions. Mouth:  No deformity or lesions,oropharynx pink & moist. Neck:  Supple; no masses or thyromegaly. Lungs:  Respirations even and unlabored.  Clear throughout to auscultation.   No wheezes, crackles, or rhonchi. No acute distress. Heart:  Regular rate and rhythm; no murmurs, clicks, rubs, or gallops. Abdomen:  Normal bowel sounds. Soft, non-tender and non-distended without masses, hepatosplenomegaly or hernias noted.  No guarding or rebound tenderness.   Rectal: Not performed Msk:  Symmetrical  without gross deformities. Good, equal movement & strength bilaterally. Pulses:  Normal pulses noted. Extremities:  No clubbing or edema.  No cyanosis. Neurologic:  Alert and oriented x3;  grossly normal neurologically. Skin:  Intact without significant lesions or rashes. No jaundice. Psych:  Alert and cooperative. Normal mood and affect.  Imaging Studies: CT abdomen pelvis in 2018, unremarkable  Assessment and Plan:   Cole Riley is a 25 y.o. male with history of anxiety/depression is seen in consultation for epigastric discomfort, postprandial bloating and sometimes soft/greasy stools triggered after consumption of fatty foods, stress.  Labs are unrevealing.  H. pylori stool antigen test negative.  His weight is improving  Rule out celiac disease, check celiac disease panel Trial of lactobacillus probiotics, samples provided Trial of FD guard, samples provided The symptoms are persistent or if he starts losing weight, will perform upper endoscopy Patient will contact me via MyChart   Follow up as needed   Arlyss Repress, MD

## 2021-11-20 LAB — CELIAC DISEASE PANEL
Endomysial IgA: NEGATIVE
IgA/Immunoglobulin A, Serum: 198 mg/dL (ref 90–386)
Transglutaminase IgA: <2 U/mL (ref 0–3)

## 2021-11-28 ENCOUNTER — Encounter: Payer: 59 | Attending: Psychology | Admitting: Psychology

## 2021-11-28 ENCOUNTER — Other Ambulatory Visit: Payer: Self-pay

## 2021-11-28 DIAGNOSIS — F419 Anxiety disorder, unspecified: Secondary | ICD-10-CM | POA: Diagnosis not present

## 2021-11-28 DIAGNOSIS — F331 Major depressive disorder, recurrent, moderate: Secondary | ICD-10-CM | POA: Diagnosis not present

## 2021-11-29 ENCOUNTER — Encounter: Payer: Self-pay | Admitting: Psychology

## 2021-11-29 NOTE — Progress Notes (Signed)
Neuropsychological Consultation   Patient:   Cole Riley   DOB:   11-Jan-1996  MR Number:  [id number]  Location:  [facility] FOR PAIN AND [facility] MEDICINE [facility] PHYSICAL MEDICINE AND REHABILITATION [address] [place], [address] [id number] [facility] [place] [address] Dept: [id number]           Date of Service:   11/28/2021  Start Time:   3 PM End Time:   4 PM  Provider/Observer:  Arley Phenix, Psy.D.       Clinical Neuropsychologist       Billing Code/Service: Diagnostic clinical interview  Today's visit was an in person visit that was conducted in my outpatient clinic office.  The patient myself were present for this visit.  Reason for Service:  Cole Riley is a 25 year old male referred for psychotherapeutic interventions by his PCP Larae Grooms, NP.  The patient has been dealing with depression off and on for "as long as he can remember."  Patient reports that his depressive events come and go and he feels like they hit in "waves" for a few months and then he feels better.  The patient reports that he started developing depressive symptomatology for 5 years ago.  The patient reports that he started feeling and experiencing more anxiety starting around age 15 or so.  His first major panic attack happened after a break-up of a relationship and there was a lot of stress around making decisions around his life careers with completing college etc.  The patient requested a referral from his PCP for therapeutic interventions and they have been working on medication strategies as well.  The patient reports that he was tried on Effexor at one point but had a lot of side effects that sound like norepinephrine aspects of the SNRI he was prescribed.  The patient is currently taking Wellbutrin and it took a while to find the specific dosing version of Wellbutrin that worked the best for him.  The patient has not tried any cleaner SSRIs.  The patient describes his  difficulties that include depression, anxiety, difficulties with attention and concentration and motivation difficulties.  The patient reports that when he is experiencing significant depression that he has trouble getting out of bed, experiences low energy, pessimism, anhedonia and mood shifts.  The patient reports that he has difficulty controlling anger and frustration.  Anxiety is described as being stressed out more often, infrequent panic attacks and lack of focus and has developed emetophobia.  The patient has had a lot of gastrointestinal issues going back to childhood.  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  The patient reports that he has tried a number of medications to curb acid production and feels like most of these efforts resulted in a worsening of his symptoms.  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  Another possibility of his gastrointestinal symptoms could be abdominal migraine as the symptoms appear to have started when he was a child.  Behavioral Observation: VIRGINIA FRANCISCO  presents as a 25 y.o.-year-old Right Caucasian Male who appeared his stated age. his dress was Appropriate and he was Well Groomed and his manners were Appropriate to the situation.  his participation was indicative of  Appropriate behaviors.  There were not physical disabilities noted.  he displayed an appropriate level of cooperation and motivation.     Interactions:    Active Appropriate and Attentive  Attention:   within normal limits and attention span and concentration were age appropriate  Memory:   within normal limits; recent and remote memory  intact  Visuo-spatial:  not examined  Speech (Volume):  normal  Speech:   normal; normal  Thought Process:  Coherent and Relevant  Though Content:  WNL; not suicidal and not homicidal  Orientation:   person, place, time/date, and situation  Judgment:   Good  Planning:   Good  Affect:    Appropriate  Mood:    Dysphoric  Insight:   Good  Intelligence:   high  Medical History:   Past Medical History:  Diagnosis Date   Anxiety    Depression    GERD (gastroesophageal reflux disease)          Patient Active Problem List   Diagnosis Date Noted   Anxiety 05/07/2021   Depression, major, single episode, mild (HCC) 03/05/2021   Migraine without aura and without status migrainosus, not intractable 07/21/2017   GERD (gastroesophageal reflux disease) 06/06/2015    Psychiatric History:  Patient has a history of recurrent depression and anxiety symptoms including panic events on a limited basis.  Family Med/Psych History:  Family History  Problem Relation Age of Onset   Sarcoidosis Father    Diabetes Maternal Grandmother    Hypertension Maternal Grandmother    Heart disease Paternal Grandmother    Colon cancer Paternal Grandfather    Cancer Paternal Grandfather        brain   Hyperlipidemia Maternal Grandfather     Impression/DX:  The patient certainly meets the criterion for recurrent depressive disorder that would primarily be moderate in nature.  The patient is also developed a phobia around vomiting and other GI issues.  He has been diagnosed with GERD but has had no endoscopy to this point.  Other potential things to be considered would include abdominal migraines as his symptoms started as a child and also ruling out esophageal eosinophilic disease.  His symptoms do appear to be consistent with abdominal migraines around his GI symptoms.  I have discussed some of these other evaluations for some of the other difficulties.  Disposition/Plan:  While the patient would  be very appropriate for psychotherapeutic interventions our office and my availability are probably not sufficient to provide the services that would be most appropriate to him as far as psychotherapeutic interventions.  I provided him with a referral to a therapist in town who may be able to help address some of these issues from a therapeutic standpoint but I certainly believe that he would benefit from psychotherapeutic interventions around his depression and anxiety and phobias that have developed.  I also discussed with him some directions to talk with his primary care around his GI distress and phobias associated with that and to rule out other possible etiological factors including abdominal migraine and possible eosinophilic disease.  Diagnosis:    Major depressive disorder, recurrent episode, moderate (HCC)  Anxiety         Electronically Signed   _______________________ Arley Phenix, Psy.D. Clinical Neuropsychologist

## 2022-01-29 ENCOUNTER — Encounter: Payer: Self-pay | Admitting: Nurse Practitioner

## 2022-01-29 DIAGNOSIS — F419 Anxiety disorder, unspecified: Secondary | ICD-10-CM

## 2022-01-29 DIAGNOSIS — F320 Major depressive disorder, single episode, mild: Secondary | ICD-10-CM

## 2022-02-05 ENCOUNTER — Encounter: Payer: Self-pay | Admitting: Nurse Practitioner

## 2022-02-27 ENCOUNTER — Encounter: Payer: Self-pay | Admitting: Nurse Practitioner

## 2022-02-27 DIAGNOSIS — F419 Anxiety disorder, unspecified: Secondary | ICD-10-CM

## 2022-02-27 DIAGNOSIS — F320 Major depressive disorder, single episode, mild: Secondary | ICD-10-CM

## 2022-03-11 NOTE — Progress Notes (Unsigned)
There were no vitals taken for this visit.   Subjective:    Patient ID: Cole Riley, male    DOB: 10-Jan-1996, 26 y.o.   MRN: [id number]  HPI: Cole Riley is a 26 y.o. male  No chief complaint on file.  ANXIETY/DEPRESSION Patient states everything feels more manageable.  He feels like Wellbutrin 150mg . Denies SI.  He feels like a much better place than he was before.    Flowsheet Row Office Visit from 10/22/2021 in [facility]  PHQ-9 Total Score 6      GAD 7 : Generalized Anxiety Score 10/22/2021 06/20/2021 05/21/2021 05/07/2021  Nervous, Anxious, on Edge 1 1 1 3   Control/stop worrying 1 0 1 3  Worry too much - different things 1 0 0 2  Trouble relaxing 0 1 0 2  Restless 0 0 0 0  Easily annoyed or irritable 1 1 2 2   Afraid - awful might happen 0 0 1 1  Total GAD 7 Score 4 3 5 13   Anxiety Difficulty Not difficult at all Not difficult at all Somewhat difficult Somewhat difficult     Relevant past medical, surgical, family and social history reviewed and updated as indicated. Interim medical history since our last visit reviewed. Allergies and medications reviewed and updated.  Review of Systems  Psychiatric/Behavioral:  Positive for dysphoric mood. Negative for suicidal ideas. The patient is nervous/anxious.    Per HPI unless specifically indicated above     Objective:    There were no vitals taken for this visit.  Wt Readings from Last 3 Encounters:  11/16/21 160 lb (72.6 kg)  10/22/21 155 lb (70.3 kg)  10/01/21 166 lb (75.3 kg)    Physical Exam Vitals and nursing note reviewed.  Constitutional:      General: He is not in acute distress.    Appearance: Normal appearance. He is not ill-appearing, toxic-appearing or diaphoretic.  HENT:     Head: Normocephalic.     Right Ear: External ear normal.     Left Ear: External ear normal.     Nose: Nose normal. No congestion or rhinorrhea.     Mouth/Throat:     Mouth: Mucous membranes are moist.   Eyes:     General:        Right eye: No discharge.        Left eye: No discharge.     Extraocular Movements: Extraocular movements intact.     Conjunctiva/sclera: Conjunctivae normal.     Pupils: Pupils are equal, round, and reactive to light.  Cardiovascular:     Rate and Rhythm: Normal rate and regular rhythm.     Heart sounds: No murmur heard. Pulmonary:     Effort: Pulmonary effort is normal. No respiratory distress.     Breath sounds: Normal breath sounds. No wheezing, rhonchi or rales.  Abdominal:     General: Abdomen is flat. Bowel sounds are normal.  Musculoskeletal:     Cervical back: Normal range of motion and neck supple.  Skin:    General: Skin is warm and dry.     Capillary Refill: Capillary refill takes less than 2 seconds.  Neurological:     General: No focal deficit present.     Mental Status: He is alert and oriented to person, place, and time.  Psychiatric:        Mood and Affect: Mood normal.        Behavior: Behavior normal.  Thought Content: Thought content normal.        Judgment: Judgment normal.    Results for orders placed or performed in visit on 11/16/21  Celiac Disease Panel  Result Value Ref Range   Endomysial IgA Negative Negative   Transglutaminase IgA <2 0 - 3 U/mL   IgA/Immunoglobulin A, Serum 198 90 - 386 mg/dL      Assessment & Plan:   Problem List Items Addressed This Visit       Other   Depression, major, single episode, mild (HCC)   Anxiety - Primary     Follow up plan: No follow-ups on file.

## 2022-03-12 ENCOUNTER — Encounter: Payer: Self-pay | Admitting: Nurse Practitioner

## 2022-03-12 ENCOUNTER — Ambulatory Visit: Payer: 59 | Admitting: Nurse Practitioner

## 2022-03-12 ENCOUNTER — Other Ambulatory Visit: Payer: Self-pay

## 2022-03-12 VITALS — BP 107/66 | HR 74 | Temp 97.9°F | Wt 169.0 lb

## 2022-03-12 DIAGNOSIS — F320 Major depressive disorder, single episode, mild: Secondary | ICD-10-CM

## 2022-03-12 DIAGNOSIS — F419 Anxiety disorder, unspecified: Secondary | ICD-10-CM

## 2022-03-12 DIAGNOSIS — K219 Gastro-esophageal reflux disease without esophagitis: Secondary | ICD-10-CM

## 2022-03-12 MED ORDER — ARIPIPRAZOLE 2 MG PO TABS: 2.0000 mg | ORAL_TABLET | Freq: Every day | ORAL | 0 refills | Status: DC

## 2022-03-12 MED ORDER — PANTOPRAZOLE SODIUM 20 MG PO TBEC: 20.0000 mg | DELAYED_RELEASE_TABLET | Freq: Every day | ORAL | 1 refills | Status: DC

## 2022-03-12 MED ORDER — LURASIDONE HCL 20 MG PO TABS: 20.0000 mg | ORAL_TABLET | Freq: Every day | ORAL | 0 refills | Status: DC

## 2022-03-12 NOTE — Assessment & Plan Note (Signed)
Chronic. Not well controlled.  Will change medication from Omeprazole to Protonix 20mg  daily.  Follow up in 1 month for reevaluation. ?

## 2022-03-12 NOTE — Assessment & Plan Note (Signed)
Chronic.  Not well controlled.  Will start Abilify 2mg daily.  Side effects and benefits of medication discussed with patient during visit.  Return to clinic in 1 week for reevaluation.  Call sooner if concerns arise.  Already has a referral to psychiatry.   ? ?

## 2022-03-12 NOTE — Assessment & Plan Note (Addendum)
Chronic.  Not well controlled.  Will start Abilify 2mg  daily.  Side effects and benefits of medication discussed with patient during visit.  Return to clinic in 1 week for reevaluation.  Call sooner if concerns arise.  Already has a referral to psychiatry.   ? ?

## 2022-03-12 NOTE — Addendum Note (Signed)
Addended by: Larae Grooms on: 03/12/2022 12:56 PM ? ? Modules accepted: Orders ? ?

## 2022-03-19 ENCOUNTER — Telehealth: Payer: 59 | Admitting: Nurse Practitioner

## 2022-03-19 ENCOUNTER — Encounter: Payer: Self-pay | Admitting: Nurse Practitioner

## 2022-03-19 DIAGNOSIS — F419 Anxiety disorder, unspecified: Secondary | ICD-10-CM

## 2022-03-19 DIAGNOSIS — F320 Major depressive disorder, single episode, mild: Secondary | ICD-10-CM | POA: Diagnosis not present

## 2022-03-19 NOTE — Progress Notes (Signed)
? ?There were no vitals taken for this visit.  ? ?Subjective:  ? ? Patient ID: Cole Riley, male    DOB: 1996/10/09, 26 y.o.   MRN: [id number] ? ?HPI: ?Cole Riley is a 26 y.o. male ? ?Chief Complaint  ?Patient presents with  ? Depression  ? Anxiety  ? ?ANXIETY/DEPRESSION ?Patient presents to clinic to follow up on depression and anxiety.  Patient was started on Latuda 1 week ago.  Patient states he was tolerating the medication well up until about 2 days ago he started having some fatigue.  However, he has seen a difference in his mood since starting the medication.  He feels like it is more stable.  Denies SI.  ? ?Flowsheet Row Video Visit from 03/19/2022 in [facility]  ?PHQ-9 Total Score 9  ? ?  ? ?GAD 7 : Generalized Anxiety Score 03/19/2022 03/12/2022 10/22/2021 06/20/2021  ?Nervous, Anxious, on Edge 1 3 1 1   ?Control/stop worrying 0 3 1 0  ?Worry too much - different things 1 2 1  0  ?Trouble relaxing 0 2 0 1  ?Restless 1 1 0 0  ?Easily annoyed or irritable 2 2 1 1   ?Afraid - awful might happen 1 2 0 0  ?Total GAD 7 Score 6 15 4 3   ?Anxiety Difficulty Somewhat difficult Somewhat difficult Not difficult at all Not difficult at all  ? ? ?Patient states he was taking the Omeprazole 20mg  and did not have any resolution of his GERD.  States that he continued to have GERD even though he was taking the medication.  ? ?Relevant past medical, surgical, family and social history reviewed and updated as indicated. Interim medical history since our last visit reviewed. ?Allergies and medications reviewed and updated. ? ?Review of Systems  ?Psychiatric/Behavioral:  Positive for dysphoric mood. Negative for suicidal ideas. The patient is nervous/anxious.   ? ?Per HPI unless specifically indicated above ? ?   ?Objective:  ?  ?There were no vitals taken for this visit.  ?Wt Readings from Last 3 Encounters:  ?03/12/22 169 lb (76.7 kg)  ?11/16/21 160 lb (72.6 kg)  ?10/22/21 155 lb (70.3 kg)  ?  ?Physical  Exam ?Vitals and nursing note reviewed.  ?Constitutional:   ?   General: He is not in acute distress. ?   Appearance: He is not ill-appearing.  ?HENT:  ?   Head: Normocephalic.  ?   Right Ear: Hearing normal.  ?   Left Ear: Hearing normal.  ?   Nose: Nose normal.  ?Pulmonary:  ?   Effort: Pulmonary effort is normal. No respiratory distress.  ?Neurological:  ?   Mental Status: He is alert.  ?Psychiatric:     ?   Mood and Affect: Mood normal.     ?   Behavior: Behavior normal.     ?   Thought Content: Thought content normal.     ?   Judgment: Judgment normal.  ? ? ?Results for orders placed or performed in visit on 11/16/21  ?Celiac Disease Panel  ?Result Value Ref Range  ? Endomysial IgA Negative Negative  ? Transglutaminase IgA <2 0 - 3 U/mL  ? IgA/Immunoglobulin A, Serum 198 90 - 386 mg/dL  ? ?   ?Assessment & Plan:  ? ?Problem List Items Addressed This Visit   ? ?  ? Other  ? Depression, major, single episode, mild (HCC)  ?  Chronic. Improved with Latuda.  Discussed side effects during visit.  Will continue  with Latuda at this time. Will follow up in 2 weeks for reevaluation.  Call sooner if concerns arise.  ?  ?  ? Anxiety - Primary  ?  Chronic. Improved with Latuda.  Discussed side effects during visit.  Will continue with Latuda at this time. Will follow up in 2 weeks for reevaluation.  Call sooner if concerns arise.  ?  ?  ?  ? ?Follow up plan: ?Return in about 2 weeks (around 04/02/2022) for Depression/Anxiety FU(virtual). ? ? ?This visit was completed via MyChart due to the restrictions of the COVID-19 pandemic. All issues as above were discussed and addressed. Physical exam was done as above through visual confirmation on MyChart. If it was felt that the patient should be evaluated in the office, they were directed there. The patient verbally consented to this visit. ?Location of the patient: Home ?Location of the provider: Office ?Those involved with this call:  ?Provider: Larae Grooms, NP ?CMA: Wilhemena Durie, CMA ?Front Desk/Registration: Servando Snare ?This encounter was conducted via video.  I spent 20 dedicated to the care of this patient on the date of this encounter to include previsit review of symptoms and plan moving forward, face to face time with the patient, and post visit ordering of testing.  ? ? ? ? ? ?

## 2022-03-19 NOTE — Assessment & Plan Note (Signed)
Chronic. Improved with Latuda.  Discussed side effects during visit.  Will continue with Latuda at this time. Will follow up in 2 weeks for reevaluation.  Call sooner if concerns arise.  ?

## 2022-03-19 NOTE — Assessment & Plan Note (Signed)
Chronic. Improved with Latuda.  Discussed side effects during visit.  Will continue with Latuda at this time. Will follow up in 2 weeks for reevaluation.  Call sooner if concerns arise.  ?

## 2022-04-08 ENCOUNTER — Other Ambulatory Visit: Payer: Self-pay | Admitting: Nurse Practitioner

## 2022-04-08 NOTE — Telephone Encounter (Signed)
Requested medication (s) are due for refill today: yes ? ?Requested medication (s) are on the active medication list:Latuda is, Ambilify is not. ? ?Last refill:  03/12/22 #30 with 0 RF ? ?Future visit scheduled: 04/29/22 ? ?Notes to clinic:  Neither med is delegated, Ambilify not current med. Labs from 04/05/21, please assess.  ? ? ?  ? ?Requested Prescriptions  ?Pending Prescriptions Disp Refills  ? lurasidone (LATUDA) 20 MG TABS tablet [Pharmacy Med Name: LURASIDONE 20MG TABLETS] 30 tablet 0  ?  Sig: TAKE 1 TABLET(20 MG) BY MOUTH DAILY  ?  ? Not Delegated - Psychiatry:  Antipsychotics - Second Generation (Atypical) - lurasidone Failed - 04/08/2022  3:12 AM  ?  ?  Failed - This refill cannot be delegated  ?  ?  Failed - TSH in normal range and within 360 days  ?  TSH  ?Date Value Ref Range Status  ?04/05/2021 1.780 0.450 - 4.500 uIU/mL Final  ?  ?  ?  ?  Failed - Lipid Panel in normal range within the last 12 months  ?  Cholesterol, Total  ?Date Value Ref Range Status  ?04/05/2021 158 100 - 199 mg/dL Final  ? ?Cholesterol Piccolo, [place]  ?Date Value Ref Range Status  ?07/14/2015 120 <200 mg/dL Final  ?  Comment:  ?                          Desirable                <200 ?                        Borderline High      200- 239 ?                        High                     >239 ?  ? ?LDL Chol Calc (NIH)  ?Date Value Ref Range Status  ?04/05/2021 95 0 - 99 mg/dL Final  ? ?HDL  ?Date Value Ref Range Status  ?04/05/2021 50 >39 mg/dL Final  ? ?Triglycerides  ?Date Value Ref Range Status  ?04/05/2021 68 0 - 149 mg/dL Final  ? ?Triglycerides Piccolo,Waived  ?Date Value Ref Range Status  ?07/14/2015 54 <150 mg/dL Final  ?  Comment:  ?                          Normal                   <150 ?                        Borderline High     150 - 199 ?                        High                200 - 499 ?                        Very High                >499 ?  ? ?  ?  ?  Failed - CBC within normal limits  and completed in the last 12  months  ?  WBC  ?Date Value Ref Range Status  ?04/05/2021 4.6 3.4 - 10.8 x10E3/uL Final  ?07/02/2017 7.6 3.8 - 10.6 K/uL Final  ? ?RBC  ?Date Value Ref Range Status  ?04/05/2021 5.16 4.14 - 5.80 x10E6/uL Final  ?07/02/2017 5.53 4.40 - 5.90 MIL/uL Final  ? ?Hemoglobin  ?Date Value Ref Range Status  ?04/05/2021 15.5 13.0 - 17.7 g/dL Final  ? ?Hematocrit  ?Date Value Ref Range Status  ?04/05/2021 47.0 37.5 - 51.0 % Final  ? ?MCHC  ?Date Value Ref Range Status  ?04/05/2021 33.0 31.5 - 35.7 g/dL Final  ?07/02/2017 34.5 32.0 - 36.0 g/dL Final  ? ?MCH  ?Date Value Ref Range Status  ?04/05/2021 30.0 26.6 - 33.0 pg Final  ?07/02/2017 30.2 26.0 - 34.0 pg Final  ? ?MCV  ?Date Value Ref Range Status  ?04/05/2021 91 79 - 97 fL Final  ? ?No results found for: PLTCOUNTKUC, LABPLAT, [place] ?RDW  ?Date Value Ref Range Status  ?04/05/2021 12.9 11.6 - 15.4 % Final  ? ?  ?  ?  Failed - CMP within normal limits and completed in the last 12 months  ?  Albumin  ?Date Value Ref Range Status  ?04/05/2021 5.0 4.1 - 5.2 g/dL Final  ? ?Alkaline Phosphatase  ?Date Value Ref Range Status  ?04/05/2021 59 44 - 121 IU/L Final  ? ?ALT  ?Date Value Ref Range Status  ?04/05/2021 22 0 - 44 IU/L Final  ? ?AST  ?Date Value Ref Range Status  ?04/05/2021 19 0 - 40 IU/L Final  ? ?BUN  ?Date Value Ref Range Status  ?04/05/2021 14 6 - 20 mg/dL Final  ? ?Calcium  ?Date Value Ref Range Status  ?04/05/2021 9.5 8.7 - 10.2 mg/dL Final  ? ?CO2  ?Date Value Ref Range Status  ?04/05/2021 24 20 - 29 mmol/L Final  ? ?Creatinine, Ser  ?Date Value Ref Range Status  ?04/05/2021 1.02 0.76 - 1.27 mg/dL Final  ? ?Glucose  ?Date Value Ref Range Status  ?04/05/2021 88 65 - 99 mg/dL Final  ? ?Glucose, Bld  ?Date Value Ref Range Status  ?07/02/2017 101 (H) 65 - 99 mg/dL Final  ? ?Potassium  ?Date Value Ref Range Status  ?04/05/2021 3.9 3.5 - 5.2 mmol/L Final  ? ?Sodium  ?Date Value Ref Range Status  ?04/05/2021 141 134 - 144 mmol/L Final  ? ?Bilirubin Total  ?Date Value Ref  Range Status  ?04/05/2021 0.6 0.0 - 1.2 mg/dL Final  ? ?Protein, ur  ?Date Value Ref Range Status  ?07/02/2017 NEGATIVE NEGATIVE mg/dL Final  ? ?Protein,UA  ?Date Value Ref Range Status  ?04/05/2021 Negative Negative/Trace Final  ? ?Total Protein  ?Date Value Ref Range Status  ?04/05/2021 7.0 6.0 - 8.5 g/dL Final  ? ?GFR calc Af Amer  ?Date Value Ref Range Status  ?07/02/2017 >60 >60 mL/min Final  ?  Comment:  ?  (NOTE) ?The eGFR has been calculated using the CKD EPI equation. ?This calculation has not been validated in all clinical situations. ?eGFR's persistently <60 mL/min signify possible Chronic Kidney ?Disease. ?  ? ?eGFR  ?Date Value Ref Range Status  ?04/05/2021 105 >59 mL/min/1.73 Final  ? ?GFR calc non Af Amer  ?Date Value Ref Range Status  ?07/02/2017 >60 >60 mL/min Final  ? ?  ?  ?  Passed - Completed PHQ-2 or PHQ-9 in the last 360 days  ?  ?  Passed - Last BP in  normal range  ?  BP Readings from Last 1 Encounters:  ?03/12/22 107/66  ?  ?  ?  ?  Passed - Last Heart Rate in normal range  ?  Pulse Readings from Last 1 Encounters:  ?03/12/22 74  ?  ?  ?  ?  Passed - Valid encounter within last 6 months  ?  Recent Outpatient Visits   ? ?      ? 2 weeks ago Anxiety  ? [place], NP  ? 3 weeks ago Anxiety  ? [place], NP  ? 5 months ago Depression, major, single episode, mild ([place])  ? [place], NP  ? 6 months ago Gastroesophageal reflux disease, unspecified whether esophagitis present  ? [place], NP  ? 7 months ago Depression, major, single episode, mild ([place])  ? [facility] Jon Billings, NP  ? ?  ?  ?Future Appointments   ? ?        ? In 3 weeks Jon Billings, NP [facility]  ? ?  ? ?  ?  ?  ? ARIPiprazole (ABILIFY) 2 MG tablet [Pharmacy Med Name: ARIPIPRAZOLE 2MG TABLETS] 30 tablet 0  ?  Sig: TAKE 1 TABLET(2 MG) BY MOUTH DAILY  ?  ?  Not Delegated - Psychiatry:  Antipsychotics - Second Generation (Atypical) - aripiprazole Failed - 04/08/2022  3:12 AM  ?  ?  Failed - This refill cannot be delegated  ?  ?  Failed - TSH in normal range and within 360 days  ?  TSH  ?Date Value Ref Range Status  ?04/05/2021 1.780 0.450 - 4.500 uIU/mL Final  ?  ?  ?  ?  Failed - Lipid Panel in normal range within the last 12 months  ?  Cholesterol, Total  ?Date Value Ref Range Status  ?04/05/2021 158 100 - 199 mg/dL Final  ? ?Cholesterol Piccolo, [place]  ?Date Value Ref Range Status  ?07/14/2015 120 <200 mg/dL Final  ?  Comment:  ?                          Desirable                <200 ?                        Borderline High      200- 239 ?                        High                     >239 ?  ? ?LDL Chol Calc (NIH)  ?Date Value Ref Range Status  ?04/05/2021 95 0 - 99 mg/dL Final  ? ?HDL  ?Date Value Ref Range Status  ?04/05/2021 50 >39 mg/dL Final  ? ?Triglycerides  ?Date Value Ref Range Status  ?04/05/2021 68 0 - 149 mg/dL Final  ? ?Triglycerides Piccolo,Waived  ?Date Value Ref Range Status  ?07/14/2015 54 <150 mg/dL Final  ?  Comment:  ?                          Normal                   <150 ?  Borderline High     150 - 199 ?                        High                200 - 499 ?                        Very High                >499 ?  ? ?  ?  ?  Failed - CBC within normal limits and completed in the last 12 months  ?  WBC  ?Date Value Ref Range Status  ?04/05/2021 4.6 3.4 - 10.8 x10E3/uL Final  ?07/02/2017 7.6 3.8 - 10.6 K/uL Final  ? ?RBC  ?Date Value Ref Range Status  ?04/05/2021 5.16 4.14 - 5.80 x10E6/uL Final  ?07/02/2017 5.53 4.40 - 5.90 MIL/uL Final  ? ?Hemoglobin  ?Date Value Ref Range Status  ?04/05/2021 15.5 13.0 - 17.7 g/dL Final  ? ?Hematocrit  ?Date Value Ref Range Status  ?04/05/2021 47.0 37.5 - 51.0 % Final  ? ?MCHC  ?Date Value Ref Range Status  ?04/05/2021 33.0 31.5 - 35.7 g/dL Final  ?07/02/2017 34.5 32.0 - 36.0 g/dL Final   ? ?MCH  ?Date Value Ref Range Status  ?04/05/2021 30.0 26.6 - 33.0 pg Final  ?07/02/2017 30.2 26.0 - 34.0 pg Final  ? ?MCV  ?Date Value Ref Range Status  ?04/05/2021 91 79 - 97 fL Final  ? ?No results found

## 2022-04-09 ENCOUNTER — Other Ambulatory Visit: Payer: Self-pay | Admitting: Nurse Practitioner

## 2022-04-09 MED ORDER — LURASIDONE HCL 20 MG PO TABS: 20.0000 mg | ORAL_TABLET | Freq: Every day | ORAL | 0 refills | Status: DC

## 2022-04-09 NOTE — Telephone Encounter (Signed)
Patient is no longer on Abilify, only Latuda ?

## 2022-04-09 NOTE — Telephone Encounter (Signed)
This was already sent in today by [facility]- can we check with the pharmacy and see what's going on? ?

## 2022-04-18 ENCOUNTER — Encounter: Payer: Self-pay | Admitting: Nurse Practitioner

## 2022-04-18 DIAGNOSIS — F419 Anxiety disorder, unspecified: Secondary | ICD-10-CM

## 2022-04-18 DIAGNOSIS — F320 Major depressive disorder, single episode, mild: Secondary | ICD-10-CM

## 2022-04-26 ENCOUNTER — Ambulatory Visit: Payer: 59 | Admitting: Nurse Practitioner

## 2022-04-29 ENCOUNTER — Ambulatory Visit: Payer: 59 | Admitting: Nurse Practitioner

## 2022-05-07 ENCOUNTER — Other Ambulatory Visit: Payer: Self-pay | Admitting: Nurse Practitioner

## 2022-05-08 NOTE — Telephone Encounter (Signed)
Requested medication (s) are due for refill today: yes ? ?Requested medication (s) are on the active medication list: yes ? ?Last refill:  04/09/22 ? ?Future visit scheduled: yes ? ?Notes to clinic:  Unable to refill per protocol, cannot delegate.  ? ? ?  ?Requested Prescriptions  ?Pending Prescriptions Disp Refills  ? lurasidone (LATUDA) 20 MG TABS tablet [Pharmacy Med Name: LURASIDONE 20MG TABLETS] 30 tablet 0  ?  Sig: TAKE 1 TABLET(20 MG) BY MOUTH DAILY  ?  ? Not Delegated - Psychiatry:  Antipsychotics - Second Generation (Atypical) - lurasidone Failed - 05/07/2022  3:14 AM  ?  ?  Failed - This refill cannot be delegated  ?  ?  Failed - TSH in normal range and within 360 days  ?  TSH  ?Date Value Ref Range Status  ?04/05/2021 1.780 0.450 - 4.500 uIU/mL Final  ?  ?  ?  ?  Failed - Lipid Panel in normal range within the last 12 months  ?  Cholesterol, Total  ?Date Value Ref Range Status  ?04/05/2021 158 100 - 199 mg/dL Final  ? ?Cholesterol Piccolo, [place]  ?Date Value Ref Range Status  ?07/14/2015 120 <200 mg/dL Final  ?  Comment:  ?                          Desirable                <200 ?                        Borderline High      200- 239 ?                        High                     >239 ?  ? ?LDL Chol Calc (NIH)  ?Date Value Ref Range Status  ?04/05/2021 95 0 - 99 mg/dL Final  ? ?HDL  ?Date Value Ref Range Status  ?04/05/2021 50 >39 mg/dL Final  ? ?Triglycerides  ?Date Value Ref Range Status  ?04/05/2021 68 0 - 149 mg/dL Final  ? ?Triglycerides Piccolo,Waived  ?Date Value Ref Range Status  ?07/14/2015 54 <150 mg/dL Final  ?  Comment:  ?                          Normal                   <150 ?                        Borderline High     150 - 199 ?                        High                200 - 499 ?                        Very High                >499 ?  ? ?  ?  ?  Failed - CBC within normal limits and completed in the last 12 months  ?  WBC  ?Date Value  Ref Range Status  ?04/05/2021 4.6 3.4 - 10.8 x10E3/uL  Final  ?07/02/2017 7.6 3.8 - 10.6 K/uL Final  ? ?RBC  ?Date Value Ref Range Status  ?04/05/2021 5.16 4.14 - 5.80 x10E6/uL Final  ?07/02/2017 5.53 4.40 - 5.90 MIL/uL Final  ? ?Hemoglobin  ?Date Value Ref Range Status  ?04/05/2021 15.5 13.0 - 17.7 g/dL Final  ? ?Hematocrit  ?Date Value Ref Range Status  ?04/05/2021 47.0 37.5 - 51.0 % Final  ? ?MCHC  ?Date Value Ref Range Status  ?04/05/2021 33.0 31.5 - 35.7 g/dL Final  ?07/02/2017 34.5 32.0 - 36.0 g/dL Final  ? ?MCH  ?Date Value Ref Range Status  ?04/05/2021 30.0 26.6 - 33.0 pg Final  ?07/02/2017 30.2 26.0 - 34.0 pg Final  ? ?MCV  ?Date Value Ref Range Status  ?04/05/2021 91 79 - 97 fL Final  ? ?No results found for: PLTCOUNTKUC, LABPLAT, [place] ?RDW  ?Date Value Ref Range Status  ?04/05/2021 12.9 11.6 - 15.4 % Final  ? ?  ?  ?  Failed - CMP within normal limits and completed in the last 12 months  ?  Albumin  ?Date Value Ref Range Status  ?04/05/2021 5.0 4.1 - 5.2 g/dL Final  ? ?Alkaline Phosphatase  ?Date Value Ref Range Status  ?04/05/2021 59 44 - 121 IU/L Final  ? ?ALT  ?Date Value Ref Range Status  ?04/05/2021 22 0 - 44 IU/L Final  ? ?AST  ?Date Value Ref Range Status  ?04/05/2021 19 0 - 40 IU/L Final  ? ?BUN  ?Date Value Ref Range Status  ?04/05/2021 14 6 - 20 mg/dL Final  ? ?Calcium  ?Date Value Ref Range Status  ?04/05/2021 9.5 8.7 - 10.2 mg/dL Final  ? ?CO2  ?Date Value Ref Range Status  ?04/05/2021 24 20 - 29 mmol/L Final  ? ?Creatinine, Ser  ?Date Value Ref Range Status  ?04/05/2021 1.02 0.76 - 1.27 mg/dL Final  ? ?Glucose  ?Date Value Ref Range Status  ?04/05/2021 88 65 - 99 mg/dL Final  ? ?Glucose, Bld  ?Date Value Ref Range Status  ?07/02/2017 101 (H) 65 - 99 mg/dL Final  ? ?Potassium  ?Date Value Ref Range Status  ?04/05/2021 3.9 3.5 - 5.2 mmol/L Final  ? ?Sodium  ?Date Value Ref Range Status  ?04/05/2021 141 134 - 144 mmol/L Final  ? ?Bilirubin Total  ?Date Value Ref Range Status  ?04/05/2021 0.6 0.0 - 1.2 mg/dL Final  ? ?Protein, ur  ?Date Value Ref  Range Status  ?07/02/2017 NEGATIVE NEGATIVE mg/dL Final  ? ?Protein,UA  ?Date Value Ref Range Status  ?04/05/2021 Negative Negative/Trace Final  ? ?Total Protein  ?Date Value Ref Range Status  ?04/05/2021 7.0 6.0 - 8.5 g/dL Final  ? ?GFR calc Af Amer  ?Date Value Ref Range Status  ?07/02/2017 >60 >60 mL/min Final  ?  Comment:  ?  (NOTE) ?The eGFR has been calculated using the CKD EPI equation. ?This calculation has not been validated in all clinical situations. ?eGFR's persistently <60 mL/min signify possible Chronic Kidney ?Disease. ?  ? ?eGFR  ?Date Value Ref Range Status  ?04/05/2021 105 >59 mL/min/1.73 Final  ? ?GFR calc non Af Amer  ?Date Value Ref Range Status  ?07/02/2017 >60 >60 mL/min Final  ? ?  ?  ?  Passed - Completed PHQ-2 or PHQ-9 in the last 360 days  ?  ?  Passed - Last BP in normal range  ?  BP Readings from Last 1 Encounters:  ?03/12/22 107/66  ?  ?  ?  ?  Passed - Last Heart Rate in normal range  ?  Pulse Readings from Last 1 Encounters:  ?03/12/22 74  ?  ?  ?  ?  Passed - Valid encounter within last 6 months  ?  Recent Outpatient Visits   ? ?      ? 1 month ago Anxiety  ? [place], NP  ? 1 month ago Anxiety  ? [place], NP  ? 6 months ago Depression, major, single episode, mild ([place])  ? [place], NP  ? 7 months ago Gastroesophageal reflux disease, unspecified whether esophagitis present  ? [place], NP  ? 8 months ago Depression, major, single episode, mild ([place])  ? [facility] Jon Billings, NP  ? ?  ?  ?Future Appointments   ? ?        ? In 1 week Jon Billings, NP [facility], [facility]  ? ?  ? ? ?  ?  ?  ? ? ?

## 2022-05-08 NOTE — Telephone Encounter (Signed)
Patient has an appointment next week.  

## 2022-05-14 ENCOUNTER — Ambulatory Visit: Payer: 59 | Admitting: Psychology

## 2022-05-17 ENCOUNTER — Ambulatory Visit: Payer: 59 | Admitting: Nurse Practitioner

## 2022-05-17 ENCOUNTER — Encounter: Payer: Self-pay | Admitting: Nurse Practitioner

## 2022-05-17 VITALS — BP 113/52 | HR 86 | Temp 98.6°F | Wt 175.6 lb

## 2022-05-17 DIAGNOSIS — F419 Anxiety disorder, unspecified: Secondary | ICD-10-CM

## 2022-05-17 DIAGNOSIS — F320 Major depressive disorder, single episode, mild: Secondary | ICD-10-CM

## 2022-05-17 MED ORDER — ARIPIPRAZOLE 2 MG PO TABS: 2.0000 mg | ORAL_TABLET | Freq: Every day | ORAL | 0 refills | Status: DC

## 2022-05-17 NOTE — Assessment & Plan Note (Signed)
Chronic.  Improved with Latuda.  However, he is having upset stomach with the medication. Will change to Abilify to see if side effects improve.  Follow up in 2 months for reevaluation. Call sooner if concerns arise.  

## 2022-05-17 NOTE — Assessment & Plan Note (Signed)
Chronic.  Improved with Latuda.  However, he is having upset stomach with the medication. Will change to Abilify to see if side effects improve.  Follow up in 2 months for reevaluation. Call sooner if concerns arise.

## 2022-05-17 NOTE — Progress Notes (Signed)
BP (!) 113/52   Pulse 86   Temp 98.6 F (37 C) (Oral)   Wt 175 lb 9.6 oz (79.7 kg)   SpO2 99%   BMI 23.82 kg/m    Subjective:    Patient ID: Cole Riley, male    DOB: May 25, 1996, 26 y.o.   MRN: [id number]  HPI: Cole Riley is a 26 y.o. male  Chief Complaint  Patient presents with   Anxiety   Depression    6 month f/up , pt would like to discuss ?changing medication    ANXIETY/DEPRESSION Patient presents to clinic to follow up on depression and anxiety.  Patient states the medication was working well at first.  States the most that he is getting right now is an upset stomach.  Patient states he has been having difficulty sleeping.  States the medication was knocking him out. 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 in [facility]  PHQ-9 Total Score 17         05/17/2022    3:49 PM 03/19/2022    8:08 AM 03/12/2022   10:23 AM 10/22/2021    2:02 PM  GAD 7 : Generalized Anxiety Score  Nervous, Anxious, on Edge 1 1 3 1   Control/stop worrying 1 0 3 1  Worry too much - different things 0 1 2 1   Trouble relaxing 1 0 2 0  Restless 1 1 1  0  Easily annoyed or irritable 2 2 2 1   Afraid - awful might happen 1 1 2  0  Total GAD 7 Score 7 6 15 4   Anxiety Difficulty Somewhat difficult Somewhat difficult Somewhat difficult Not difficult at all    Patient states he was taking the Omeprazole 20mg  and did not have any resolution of his GERD.  States that he continued to have GERD even though he was taking the medication.   Relevant past medical, surgical, family and social history reviewed and updated as indicated. Interim medical history since our last visit reviewed. Allergies and medications reviewed and updated.  Review of Systems  Psychiatric/Behavioral:  Positive for dysphoric mood. Negative for suicidal ideas. The patient is nervous/anxious.    Per HPI unless specifically indicated above     Objective:     BP (!) 113/52   Pulse 86   Temp 98.6 F (37 C) (Oral)   Wt 175 lb 9.6 oz (79.7 kg)   SpO2 99%   BMI 23.82 kg/m   Wt Readings from Last 3 Encounters:  05/17/22 175 lb 9.6 oz (79.7 kg)  03/12/22 169 lb (76.7 kg)  11/16/21 160 lb (72.6 kg)    Physical Exam Vitals and nursing note reviewed.  Constitutional:      General: He is not in acute distress.    Appearance: Normal appearance. He is not ill-appearing, toxic-appearing or diaphoretic.  HENT:     Head: Normocephalic.     Right Ear: External ear normal.     Left Ear: External ear normal.     Nose: Nose normal. No congestion or rhinorrhea.     Mouth/Throat:     Mouth: Mucous membranes are moist.  Eyes:     General:        Right eye: No discharge.        Left eye: No discharge.     Extraocular Movements: Extraocular movements intact.     Conjunctiva/sclera: Conjunctivae normal.     Pupils: Pupils are equal, round, and reactive  to light.  Cardiovascular:     Rate and Rhythm: Normal rate and regular rhythm.     Heart sounds: No murmur heard. Pulmonary:     Effort: Pulmonary effort is normal. No respiratory distress.     Breath sounds: Normal breath sounds. No wheezing, rhonchi or rales.  Abdominal:     General: Abdomen is flat. Bowel sounds are normal.  Musculoskeletal:     Cervical back: Normal range of motion and neck supple.  Skin:    General: Skin is warm and dry.     Capillary Refill: Capillary refill takes less than 2 seconds.  Neurological:     General: No focal deficit present.     Mental Status: He is alert and oriented to person, place, and time.  Psychiatric:        Mood and Affect: Mood normal.        Behavior: Behavior normal.        Thought Content: Thought content normal.        Judgment: Judgment normal.    Results for orders placed or performed in visit on 11/16/21  Celiac Disease Panel  Result Value Ref Range   Endomysial IgA Negative Negative   Transglutaminase IgA <2 0 - 3 U/mL    IgA/Immunoglobulin A, Serum 198 90 - 386 mg/dL      Assessment & Plan:   Problem List Items Addressed This Visit       Other   Depression, major, single episode, mild (HCC)    Chronic.  Improved with Latuda.  However, he is having upset stomach with the medication. Will change to Abilify to see if side effects improve.  Follow up in 2 months for reevaluation. Call sooner if concerns arise.         Anxiety - Primary    Chronic.  Improved with Latuda.  However, he is having upset stomach with the medication. Will change to Abilify to see if side effects improve.  Follow up in 2 months for reevaluation. Call sooner if concerns arise.          Follow up plan: Return in about 2 weeks (around 05/31/2022) for Depression/Anxiety FU (virtual).

## 2022-05-31 ENCOUNTER — Telehealth: Payer: 59 | Admitting: Nurse Practitioner

## 2022-05-31 NOTE — Progress Notes (Deleted)
There were no vitals taken for this visit.   Subjective:    Patient ID: Cole Riley, male    DOB: Aug 30, 1996, 26 y.o.   MRN: [id number]  HPI: Cole Riley is a 26 y.o. male  No chief complaint on file.  ANXIETY/DEPRESSION Patient presents to clinic to follow up on depression and anxiety.  Patient states the medication was working well at first.  States the most that he is getting right now is an upset stomach.  Patient states he has been having difficulty sleeping.  States the medication was knocking him out. 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 in [facility]  PHQ-9 Total Score 17         05/17/2022    3:49 PM 03/19/2022    8:08 AM 03/12/2022   10:23 AM 10/22/2021    2:02 PM  GAD 7 : Generalized Anxiety Score  Nervous, Anxious, on Edge 1 1 3 1   Control/stop worrying 1 0 3 1  Worry too much - different things 0 1 2 1   Trouble relaxing 1 0 2 0  Restless 1 1 1  0  Easily annoyed or irritable 2 2 2 1   Afraid - awful might happen 1 1 2  0  Total GAD 7 Score 7 6 15 4   Anxiety Difficulty Somewhat difficult Somewhat difficult Somewhat difficult Not difficult at all    Patient states he was taking the Omeprazole 20mg  and did not have any resolution of his GERD.  States that he continued to have GERD even though he was taking the medication.   Relevant past medical, surgical, family and social history reviewed and updated as indicated. Interim medical history since our last visit reviewed. Allergies and medications reviewed and updated.  Review of Systems  Psychiatric/Behavioral:  Positive for dysphoric mood. Negative for suicidal ideas. The patient is nervous/anxious.    Per HPI unless specifically indicated above     Objective:    There were no vitals taken for this visit.  Wt Readings from Last 3 Encounters:  05/17/22 175 lb 9.6 oz (79.7 kg)  03/12/22 169 lb (76.7 kg)  11/16/21 160 lb (72.6 kg)     Physical Exam Vitals and nursing note reviewed.  Constitutional:      General: He is not in acute distress.    Appearance: Normal appearance. He is not ill-appearing, toxic-appearing or diaphoretic.  HENT:     Head: Normocephalic.     Right Ear: External ear normal.     Left Ear: External ear normal.     Nose: Nose normal. No congestion or rhinorrhea.     Mouth/Throat:     Mouth: Mucous membranes are moist.  Eyes:     General:        Right eye: No discharge.        Left eye: No discharge.     Extraocular Movements: Extraocular movements intact.     Conjunctiva/sclera: Conjunctivae normal.     Pupils: Pupils are equal, round, and reactive to light.  Cardiovascular:     Rate and Rhythm: Normal rate and regular rhythm.     Heart sounds: No murmur heard. Pulmonary:     Effort: Pulmonary effort is normal. No respiratory distress.     Breath sounds: Normal breath sounds. No wheezing, rhonchi or rales.  Abdominal:     General: Abdomen is flat. Bowel sounds are normal.  Musculoskeletal:     Cervical back: Normal  range of motion and neck supple.  Skin:    General: Skin is warm and dry.     Capillary Refill: Capillary refill takes less than 2 seconds.  Neurological:     General: No focal deficit present.     Mental Status: He is alert and oriented to person, place, and time.  Psychiatric:        Mood and Affect: Mood normal.        Behavior: Behavior normal.        Thought Content: Thought content normal.        Judgment: Judgment normal.    Results for orders placed or performed in visit on 11/16/21  Celiac Disease Panel  Result Value Ref Range   Endomysial IgA Negative Negative   Transglutaminase IgA <2 0 - 3 U/mL   IgA/Immunoglobulin A, Serum 198 90 - 386 mg/dL      Assessment & Plan:   Problem List Items Addressed This Visit       Other   Depression, major, single episode, mild (HCC) - Primary   Anxiety     Follow up plan: No follow-ups on  file.

## 2022-06-05 ENCOUNTER — Encounter: Payer: Self-pay | Admitting: Nurse Practitioner

## 2022-06-05 ENCOUNTER — Telehealth (INDEPENDENT_AMBULATORY_CARE_PROVIDER_SITE_OTHER): Payer: 59 | Admitting: Nurse Practitioner

## 2022-06-05 DIAGNOSIS — F419 Anxiety disorder, unspecified: Secondary | ICD-10-CM

## 2022-06-05 DIAGNOSIS — F320 Major depressive disorder, single episode, mild: Secondary | ICD-10-CM | POA: Diagnosis not present

## 2022-06-05 NOTE — Progress Notes (Signed)
There were no vitals taken for this visit.   Subjective:    Patient ID: Cole Riley, male    DOB: 10-28-1996, 26 y.o.   MRN: [id number]  HPI: Cole Riley is a 26 y.o. male  Chief Complaint  Patient presents with   Anxiety   Depression    2 week follow up    ANXIETY/DEPRESSION Patient states he isn't the biggest fan of the abilify.  States he is feeling more depressed than when he was on the [place] and doesn't have any energy at all.  He is having a lot of nausea also.  Denies SI.   Flowsheet Row Video Visit from 06/05/2022 in [facility]  PHQ-9 Total Score 19         06/05/2022    4:27 PM 05/17/2022    3:49 PM 03/19/2022    8:08 AM 03/12/2022   10:23 AM  GAD 7 : Generalized Anxiety Score  Nervous, Anxious, on Edge 2 1 1 3   Control/stop worrying 1 1 0 3  Worry too much - different things 1 0 1 2  Trouble relaxing 2 1 0 2  Restless 2 1 1 1   Easily annoyed or irritable 2 2 2 2   Afraid - awful might happen 1 1 1 2   Total GAD 7 Score 11 7 6 15   Anxiety Difficulty Somewhat difficult Somewhat difficult Somewhat difficult Somewhat difficult     Relevant past medical, surgical, family and social history reviewed and updated as indicated. Interim medical history since our last visit reviewed. Allergies and medications reviewed and updated.  Review of Systems  Psychiatric/Behavioral:  Positive for dysphoric mood. Negative for suicidal ideas. The patient is nervous/anxious.     Per HPI unless specifically indicated above     Objective:    There were no vitals taken for this visit.  Wt Readings from Last 3 Encounters:  05/17/22 175 lb 9.6 oz (79.7 kg)  03/12/22 169 lb (76.7 kg)  11/16/21 160 lb (72.6 kg)    Physical Exam Vitals and nursing note reviewed.  Constitutional:      General: He is not in acute distress.    Appearance: He is not ill-appearing.  HENT:     Head: Normocephalic.     Right Ear: Hearing normal.     Left Ear: Hearing normal.      Nose: Nose normal.  Pulmonary:     Effort: Pulmonary effort is normal. No respiratory distress.  Neurological:     Mental Status: He is alert.  Psychiatric:        Mood and Affect: Mood normal.        Behavior: Behavior normal.        Thought Content: Thought content normal.        Judgment: Judgment normal.     Results for orders placed or performed in visit on 11/16/21  Celiac Disease Panel  Result Value Ref Range   Endomysial IgA Negative Negative   Transglutaminase IgA <2 0 - 3 U/mL   IgA/Immunoglobulin A, Serum 198 90 - 386 mg/dL      Assessment & Plan:   Problem List Items Addressed This Visit       Other   Depression, major, single episode, mild (HCC)    Chronic. Not well controlled. Patient has tried and failed several medications.  Patient most recently had less depressive symptoms on Latuda but he was having a lot of fatigue and nausea.  Abilify made symptoms worse.  Patient has an appointment with Psychiatry in two weeks.  Will wean down and stop Abilify over the next week.  Keep appointment with Psychiatry. Will follow up in 1 month for reevaluation.      Anxiety - Primary     Follow up plan: Return in about 1 month (around 07/05/2022) for Depression/Anxiety FU.   This visit was completed via MyChart due to the restrictions of the COVID-19 pandemic. All issues as above were discussed and addressed. Physical exam was done as above through visual confirmation on MyChart. If it was felt that the patient should be evaluated in the office, they were directed there. The patient verbally consented to this visit. Location of the patient: Home Location of the provider: Office Those involved with this call:  Provider: Larae Grooms, NP CMA: Anitra Lauth, CMA Front Desk/Registration: Servando Snare This encounter was conducted via video.  I spent 20 dedicated to the care of this patient on the date of this encounter to include previsit review of symptoms, plan of  care and follow up, face to face time with the patient, and post visit ordering of testing.

## 2022-06-06 ENCOUNTER — Other Ambulatory Visit: Payer: Self-pay | Admitting: Nurse Practitioner

## 2022-06-06 NOTE — Telephone Encounter (Signed)
Requested medication (s) are due for refill today- no  Requested medication (s) are on the active medication list -no  Future visit scheduled -no  Last refill: unsure  Notes to clinic: medication no longer on current medication list  Requested Prescriptions  Pending Prescriptions Disp Refills   lurasidone (LATUDA) 20 MG TABS tablet [Pharmacy Med Name: LURASIDONE $RemoveBeforeDE'20MG'[id number]$  TABLETS] 30 tablet 0    Sig: TAKE 1 TABLET(20 MG) BY MOUTH DAILY     Not Delegated - Psychiatry:  Antipsychotics - Second Generation (Atypical) - lurasidone Failed - 06/06/2022  3:12 AM      Failed - This refill cannot be delegated      Failed - TSH in normal range and within 360 days    TSH  Date Value Ref Range Status  04/05/2021 1.780 0.450 - 4.500 uIU/mL Final         Failed - Lipid Panel in normal range within the last 12 months    Cholesterol, Total  Date Value Ref Range Status  04/05/2021 158 100 - 199 mg/dL Final   Cholesterol Piccolo, Waived  Date Value Ref Range Status  07/14/2015 120 <200 mg/dL Final    Comment:                            Desirable                <200                         Borderline High      200- 239                         High                     >239    LDL Chol Calc (NIH)  Date Value Ref Range Status  04/05/2021 95 0 - 99 mg/dL Final   HDL  Date Value Ref Range Status  04/05/2021 50 >39 mg/dL Final   Triglycerides  Date Value Ref Range Status  04/05/2021 68 0 - 149 mg/dL Final   Triglycerides Piccolo,Waived  Date Value Ref Range Status  07/14/2015 54 <150 mg/dL Final    Comment:                            Normal                   <150                         Borderline High     150 - 199                         High                200 - 499                         Very High                >499          Failed - CBC within normal limits and completed in the last 12 months    WBC  Date Value Ref Range Status  04/05/2021 4.6 3.4 -  10.8 x10E3/uL Final   07/02/2017 7.6 3.8 - 10.6 K/uL Final   RBC  Date Value Ref Range Status  04/05/2021 5.16 4.14 - 5.80 x10E6/uL Final  07/02/2017 5.53 4.40 - 5.90 MIL/uL Final   Hemoglobin  Date Value Ref Range Status  04/05/2021 15.5 13.0 - 17.7 g/dL Final   Hematocrit  Date Value Ref Range Status  04/05/2021 47.0 37.5 - 51.0 % Final   MCHC  Date Value Ref Range Status  04/05/2021 33.0 31.5 - 35.7 g/dL Final  07/02/2017 34.5 32.0 - 36.0 g/dL Final   [facility]  Date Value Ref Range Status  04/05/2021 30.0 26.6 - 33.0 pg Final  07/02/2017 30.2 26.0 - 34.0 pg Final   MCV  Date Value Ref Range Status  04/05/2021 91 79 - 97 fL Final   No results found for: "PLTCOUNTKUC", "LABPLAT", "POCPLA" RDW  Date Value Ref Range Status  04/05/2021 12.9 11.6 - 15.4 % Final         Failed - CMP within normal limits and completed in the last 12 months    Albumin  Date Value Ref Range Status  04/05/2021 5.0 4.1 - 5.2 g/dL Final   Alkaline Phosphatase  Date Value Ref Range Status  04/05/2021 59 44 - 121 IU/L Final   ALT  Date Value Ref Range Status  04/05/2021 22 0 - 44 IU/L Final   AST  Date Value Ref Range Status  04/05/2021 19 0 - 40 IU/L Final   BUN  Date Value Ref Range Status  04/05/2021 14 6 - 20 mg/dL Final   Calcium  Date Value Ref Range Status  04/05/2021 9.5 8.7 - 10.2 mg/dL Final   CO2  Date Value Ref Range Status  04/05/2021 24 20 - 29 mmol/L Final   Creatinine, Ser  Date Value Ref Range Status  04/05/2021 1.02 0.76 - 1.27 mg/dL Final   Glucose  Date Value Ref Range Status  04/05/2021 88 65 - 99 mg/dL Final   Glucose, Bld  Date Value Ref Range Status  07/02/2017 101 (H) 65 - 99 mg/dL Final   Potassium  Date Value Ref Range Status  04/05/2021 3.9 3.5 - 5.2 mmol/L Final   Sodium  Date Value Ref Range Status  04/05/2021 141 134 - 144 mmol/L Final   Bilirubin Total  Date Value Ref Range Status  04/05/2021 0.6 0.0 - 1.2 mg/dL Final   Protein, ur  Date Value Ref  Range Status  07/02/2017 NEGATIVE NEGATIVE mg/dL Final   Protein,UA  Date Value Ref Range Status  04/05/2021 Negative Negative/Trace Final   Total Protein  Date Value Ref Range Status  04/05/2021 7.0 6.0 - 8.5 g/dL Final   GFR calc Af Amer  Date Value Ref Range Status  07/02/2017 >60 >60 mL/min Final    Comment:    (NOTE) The eGFR has been calculated using the CKD EPI equation. This calculation has not been validated in all clinical situations. eGFR's persistently <60 mL/min signify possible Chronic Kidney Disease.    eGFR  Date Value Ref Range Status  04/05/2021 105 >59 mL/min/1.73 Final   GFR calc non Af Amer  Date Value Ref Range Status  07/02/2017 >60 >60 mL/min Final         Passed - Completed PHQ-2 or PHQ-9 in the last 360 days      Passed - Last BP in normal range    BP Readings from Last 1 Encounters:  05/17/22 (!) 113/52  Passed - Last Heart Rate in normal range    Pulse Readings from Last 1 Encounters:  05/17/22 86         Passed - Valid encounter within last 6 months    Recent Outpatient Visits           [place] Jon Billings, NP   2 weeks ago [place] Jon Billings, NP   2 months ago [place] Jon Billings, NP   2 months ago [place], [place], NP   7 months ago Depression, major, single episode, mild ([place])   [facility] Jon Billings, NP                 Requested Prescriptions  Pending Prescriptions Disp Refills   lurasidone (LATUDA) 20 MG TABS tablet [Pharmacy Med Name: LURASIDONE 20MG TABLETS] 30 tablet 0    Sig: TAKE 1 TABLET(20 MG) BY MOUTH DAILY     Not Delegated - Psychiatry:  Antipsychotics - Second Generation (Atypical) - lurasidone Failed - 06/06/2022  3:12 AM      Failed - This refill cannot be delegated      Failed - TSH in normal range and within 360 days     TSH  Date Value Ref Range Status  04/05/2021 1.780 0.450 - 4.500 uIU/mL Final         Failed - Lipid Panel in normal range within the last 12 months    Cholesterol, Total  Date Value Ref Range Status  04/05/2021 158 100 - 199 mg/dL Final   Cholesterol Piccolo, Waived  Date Value Ref Range Status  07/14/2015 120 <200 mg/dL Final    Comment:                            Desirable                <200                         Borderline High      200- 239                         High                     >239    LDL Chol Calc (NIH)  Date Value Ref Range Status  04/05/2021 95 0 - 99 mg/dL Final   HDL  Date Value Ref Range Status  04/05/2021 50 >39 mg/dL Final   Triglycerides  Date Value Ref Range Status  04/05/2021 68 0 - 149 mg/dL Final   Triglycerides Piccolo,Waived  Date Value Ref Range Status  07/14/2015 54 <150 mg/dL Final    Comment:                            Normal                   <150                         Borderline High     150 - 199  High                200 - 499                         Very High                >499          Failed - CBC within normal limits and completed in the last 12 months    WBC  Date Value Ref Range Status  04/05/2021 4.6 3.4 - 10.8 x10E3/uL Final  07/02/2017 7.6 3.8 - 10.6 K/uL Final   RBC  Date Value Ref Range Status  04/05/2021 5.16 4.14 - 5.80 x10E6/uL Final  07/02/2017 5.53 4.40 - 5.90 MIL/uL Final   Hemoglobin  Date Value Ref Range Status  04/05/2021 15.5 13.0 - 17.7 g/dL Final   Hematocrit  Date Value Ref Range Status  04/05/2021 47.0 37.5 - 51.0 % Final   MCHC  Date Value Ref Range Status  04/05/2021 33.0 31.5 - 35.7 g/dL Final  07/02/2017 34.5 32.0 - 36.0 g/dL Final   [facility]  Date Value Ref Range Status  04/05/2021 30.0 26.6 - 33.0 pg Final  07/02/2017 30.2 26.0 - 34.0 pg Final   MCV  Date Value Ref Range Status  04/05/2021 91 79 - 97 fL Final   No results found for: "PLTCOUNTKUC",  "LABPLAT", "POCPLA" RDW  Date Value Ref Range Status  04/05/2021 12.9 11.6 - 15.4 % Final         Failed - CMP within normal limits and completed in the last 12 months    Albumin  Date Value Ref Range Status  04/05/2021 5.0 4.1 - 5.2 g/dL Final   Alkaline Phosphatase  Date Value Ref Range Status  04/05/2021 59 44 - 121 IU/L Final   ALT  Date Value Ref Range Status  04/05/2021 22 0 - 44 IU/L Final   AST  Date Value Ref Range Status  04/05/2021 19 0 - 40 IU/L Final   BUN  Date Value Ref Range Status  04/05/2021 14 6 - 20 mg/dL Final   Calcium  Date Value Ref Range Status  04/05/2021 9.5 8.7 - 10.2 mg/dL Final   CO2  Date Value Ref Range Status  04/05/2021 24 20 - 29 mmol/L Final   Creatinine, Ser  Date Value Ref Range Status  04/05/2021 1.02 0.76 - 1.27 mg/dL Final   Glucose  Date Value Ref Range Status  04/05/2021 88 65 - 99 mg/dL Final   Glucose, Bld  Date Value Ref Range Status  07/02/2017 101 (H) 65 - 99 mg/dL Final   Potassium  Date Value Ref Range Status  04/05/2021 3.9 3.5 - 5.2 mmol/L Final   Sodium  Date Value Ref Range Status  04/05/2021 141 134 - 144 mmol/L Final   Bilirubin Total  Date Value Ref Range Status  04/05/2021 0.6 0.0 - 1.2 mg/dL Final   Protein, ur  Date Value Ref Range Status  07/02/2017 NEGATIVE NEGATIVE mg/dL Final   Protein,UA  Date Value Ref Range Status  04/05/2021 Negative Negative/Trace Final   Total Protein  Date Value Ref Range Status  04/05/2021 7.0 6.0 - 8.5 g/dL Final   GFR calc Af Amer  Date Value Ref Range Status  07/02/2017 >60 >60 mL/min Final    Comment:    (NOTE) The eGFR has been calculated using the CKD EPI equation. This calculation has not been validated in all  clinical situations. eGFR's persistently <60 mL/min signify possible Chronic Kidney Disease.    eGFR  Date Value Ref Range Status  04/05/2021 105 >59 mL/min/1.73 Final   GFR calc non Af Amer  Date Value Ref Range Status   07/02/2017 >60 >60 mL/min Final         Passed - Completed PHQ-2 or PHQ-9 in the last 360 days      Passed - Last BP in normal range    BP Readings from Last 1 Encounters:  05/17/22 (!) 113/52         Passed - Last Heart Rate in normal range    Pulse Readings from Last 1 Encounters:  05/17/22 86         Passed - Valid encounter within last 6 months    Recent Outpatient Visits           [place] Jon Billings, NP   2 weeks ago [place], Karen, NP   2 months ago [place], Karen, NP   2 months ago [place], NP   7 months ago Depression, major, single episode, mild [facility])   [facility] Jon Billings, NP

## 2022-06-06 NOTE — Assessment & Plan Note (Signed)
Chronic. Not well controlled. Patient has tried and failed several medications.  Patient most recently had less depressive symptoms on Latuda but he was having a lot of fatigue and nausea.  Abilify made symptoms worse.  Patient has an appointment with Psychiatry in two weeks.  Will wean down and stop Abilify over the next week.  Keep appointment with Psychiatry. Will follow up in 1 month for reevaluation.

## 2022-06-10 NOTE — Progress Notes (Signed)
LVM asking patient to call back to schedule follow up  

## 2022-06-11 NOTE — Progress Notes (Signed)
2nd attempt- LVM asking patient to call back to schedule an appt ?

## 2022-06-12 NOTE — Progress Notes (Signed)
Sent mychart message to ask patient to call back to schedule his follow up

## 2022-06-13 ENCOUNTER — Encounter: Payer: Self-pay | Admitting: Nurse Practitioner

## 2022-06-14 ENCOUNTER — Other Ambulatory Visit: Payer: Self-pay | Admitting: Nurse Practitioner

## 2022-06-14 NOTE — Telephone Encounter (Signed)
Requested by interface surescripts. Medication discontinued 06/06/22.  Requested Prescriptions  Refused Prescriptions Disp Refills  . ARIPiprazole (ABILIFY) 2 MG tablet [Pharmacy Med Name: ARIPIPRAZOLE 2MG TABLETS] 30 tablet 0    Sig: TAKE 1 TABLET(2 MG) BY MOUTH DAILY     Not Delegated - Psychiatry:  Antipsychotics - Second Generation (Atypical) - aripiprazole Failed - 06/14/2022  3:12 AM      Failed - This refill cannot be delegated      Failed - TSH in normal range and within 360 days    TSH  Date Value Ref Range Status  04/05/2021 1.780 0.450 - 4.500 uIU/mL Final         Failed - Lipid Panel in normal range within the last 12 months    Cholesterol, Total  Date Value Ref Range Status  04/05/2021 158 100 - 199 mg/dL Final   Cholesterol Piccolo, Waived  Date Value Ref Range Status  07/14/2015 120 <200 mg/dL Final    Comment:                            Desirable                <200                         Borderline High      200- 239                         High                     >239    LDL Chol Calc (NIH)  Date Value Ref Range Status  04/05/2021 95 0 - 99 mg/dL Final   HDL  Date Value Ref Range Status  04/05/2021 50 >39 mg/dL Final   Triglycerides  Date Value Ref Range Status  04/05/2021 68 0 - 149 mg/dL Final   Triglycerides Piccolo,Waived  Date Value Ref Range Status  07/14/2015 54 <150 mg/dL Final    Comment:                            Normal                   <150                         Borderline High     150 - 199                         High                200 - 499                         Very High                >499          Failed - CBC within normal limits and completed in the last 12 months    WBC  Date Value Ref Range Status  04/05/2021 4.6 3.4 - 10.8 x10E3/uL Final  07/02/2017 7.6 3.8 - 10.6 K/uL Final   RBC  Date Value Ref Range Status  04/05/2021 5.16 4.14 - 5.80 x10E6/uL Final  07/02/2017 5.53 4.40 -  5.90 MIL/uL Final   Hemoglobin   Date Value Ref Range Status  04/05/2021 15.5 13.0 - 17.7 g/dL Final   Hematocrit  Date Value Ref Range Status  04/05/2021 47.0 37.5 - 51.0 % Final   MCHC  Date Value Ref Range Status  04/05/2021 33.0 31.5 - 35.7 g/dL Final  07/02/2017 34.5 32.0 - 36.0 g/dL Final   [facility]  Date Value Ref Range Status  04/05/2021 30.0 26.6 - 33.0 pg Final  07/02/2017 30.2 26.0 - 34.0 pg Final   MCV  Date Value Ref Range Status  04/05/2021 91 79 - 97 fL Final   No results found for: "PLTCOUNTKUC", "LABPLAT", "POCPLA" RDW  Date Value Ref Range Status  04/05/2021 12.9 11.6 - 15.4 % Final         Failed - CMP within normal limits and completed in the last 12 months    Albumin  Date Value Ref Range Status  04/05/2021 5.0 4.1 - 5.2 g/dL Final   Alkaline Phosphatase  Date Value Ref Range Status  04/05/2021 59 44 - 121 IU/L Final   ALT  Date Value Ref Range Status  04/05/2021 22 0 - 44 IU/L Final   AST  Date Value Ref Range Status  04/05/2021 19 0 - 40 IU/L Final   BUN  Date Value Ref Range Status  04/05/2021 14 6 - 20 mg/dL Final   Calcium  Date Value Ref Range Status  04/05/2021 9.5 8.7 - 10.2 mg/dL Final   CO2  Date Value Ref Range Status  04/05/2021 24 20 - 29 mmol/L Final   Creatinine, Ser  Date Value Ref Range Status  04/05/2021 1.02 0.76 - 1.27 mg/dL Final   Glucose  Date Value Ref Range Status  04/05/2021 88 65 - 99 mg/dL Final   Glucose, Bld  Date Value Ref Range Status  07/02/2017 101 (H) 65 - 99 mg/dL Final   Potassium  Date Value Ref Range Status  04/05/2021 3.9 3.5 - 5.2 mmol/L Final   Sodium  Date Value Ref Range Status  04/05/2021 141 134 - 144 mmol/L Final   Bilirubin Total  Date Value Ref Range Status  04/05/2021 0.6 0.0 - 1.2 mg/dL Final   Protein, ur  Date Value Ref Range Status  07/02/2017 NEGATIVE NEGATIVE mg/dL Final   Protein,UA  Date Value Ref Range Status  04/05/2021 Negative Negative/Trace Final   Total Protein  Date Value Ref  Range Status  04/05/2021 7.0 6.0 - 8.5 g/dL Final   GFR calc Af Amer  Date Value Ref Range Status  07/02/2017 >60 >60 mL/min Final    Comment:    (NOTE) The eGFR has been calculated using the CKD EPI equation. This calculation has not been validated in all clinical situations. eGFR's persistently <60 mL/min signify possible Chronic Kidney Disease.    eGFR  Date Value Ref Range Status  04/05/2021 105 >59 mL/min/1.73 Final   GFR calc non Af Amer  Date Value Ref Range Status  07/02/2017 >60 >60 mL/min Final         Passed - Completed PHQ-2 or PHQ-9 in the last 360 days      Passed - Last BP in normal range    BP Readings from Last 1 Encounters:  05/17/22 (!) 113/52         Passed - Last Heart Rate in normal range    Pulse Readings from Last 1 Encounters:  05/17/22 86         Passed - Valid encounter  within last 6 months    Recent Outpatient Visits          1 week ago [place], NP   4 weeks ago [place], Karen, NP   2 months ago [place], NP   3 months ago [place], NP   7 months ago Depression, major, single episode, mild [facility])   [facility] Jon Billings, NP      Future Appointments            In 3 weeks Jon Billings, NP [facility], [place]

## 2022-07-05 ENCOUNTER — Encounter: Payer: Self-pay | Admitting: Nurse Practitioner

## 2022-07-05 ENCOUNTER — Telehealth (INDEPENDENT_AMBULATORY_CARE_PROVIDER_SITE_OTHER): Payer: 59 | Admitting: Nurse Practitioner

## 2022-07-05 DIAGNOSIS — F419 Anxiety disorder, unspecified: Secondary | ICD-10-CM

## 2022-07-05 DIAGNOSIS — F320 Major depressive disorder, single episode, mild: Secondary | ICD-10-CM

## 2022-07-05 NOTE — Assessment & Plan Note (Signed)
Chronic.  Not well controlled.  Has some thoughts of suicide before starting the Sertraline.  States he hasn't had them since starting the medication.  Instructed him to reach out to psychiatrist if SI return.  Continue to follow up with psychiatry.  Follow up in 3 months.  Call sooner if concerns arise.  

## 2022-07-05 NOTE — Progress Notes (Signed)
There were no vitals taken for this visit.   Subjective:    Patient ID: Cole Riley, male    DOB: [birth year], 26 y.o.   MRN: [id number]  HPI: Cole Riley is a 26 y.o. male  Chief Complaint  Patient presents with   Follow-up   ANXIETY/DEPRESSION Patient states things have been going pretty well.  He just started Sertraline 4 days ago.  He is having some side effects but is working through them.  He saw the psychiatrist and felt like things went well.  He has had some thoughts of suicide but no active plan.  He was having them before the Sertraline.    Flowsheet Row Video Visit from 07/05/2022 in [facility]  PHQ-9 Total Score 15         07/05/2022    4:01 PM 06/05/2022    4:27 PM 05/17/2022    3:49 PM 03/19/2022    8:08 AM  GAD 7 : Generalized Anxiety Score  Nervous, Anxious, on Edge 2 2 1 1   Control/stop worrying 3 1 1  0  Worry too much - different things 2 1 0 1  Trouble relaxing 2 2 1  0  Restless 2 2 1 1   Easily annoyed or irritable 1 2 2 2   Afraid - awful might happen 2 1 1 1   Total GAD 7 Score 14 11 7 6   Anxiety Difficulty Somewhat difficult Somewhat difficult Somewhat difficult Somewhat difficult     Relevant past medical, surgical, family and social history reviewed and updated as indicated. Interim medical history since our last visit reviewed. Allergies and medications reviewed and updated.  Review of Systems  Psychiatric/Behavioral:  Positive for dysphoric mood. Negative for suicidal ideas. The patient is nervous/anxious.     Per HPI unless specifically indicated above     Objective:    There were no vitals taken for this visit.  Wt Readings from Last 3 Encounters:  05/17/22 175 lb 9.6 oz (79.7 kg)  03/12/22 169 lb (76.7 kg)  11/16/21 160 lb (72.6 kg)    Physical Exam Vitals and nursing note reviewed.  Constitutional:      General: He is not in acute distress.    Appearance: He is not ill-appearing.  HENT:     Head:  Normocephalic.     Right Ear: Hearing normal.     Left Ear: Hearing normal.     Nose: Nose normal.  Pulmonary:     Effort: Pulmonary effort is normal. No respiratory distress.  Neurological:     Mental Status: He is alert.  Psychiatric:        Mood and Affect: Mood normal.        Behavior: Behavior normal.        Thought Content: Thought content normal.        Judgment: Judgment normal.     Results for orders placed or performed in visit on 11/16/21  Celiac Disease Panel  Result Value Ref Range   Endomysial IgA Negative Negative   Transglutaminase IgA <2 0 - 3 U/mL   IgA/Immunoglobulin A, Serum 198 90 - 386 mg/dL      Assessment & Plan:   Problem List Items Addressed This Visit       Other   Depression, major, single episode, mild (HCC) - Primary    Chronic.  Not well controlled.  Has some thoughts of suicide before starting the Sertraline.  States he hasn't had them since starting the medication.  Instructed him  to reach out to psychiatrist if SI return.  Continue to follow up with psychiatry.  Follow up in 3 months.  Call sooner if concerns arise.       Relevant Medications   sertraline (ZOLOFT) 50 MG tablet   Anxiety    Chronic.  Not well controlled.  Has some thoughts of suicide before starting the Sertraline.  States he hasn't had them since starting the medication.  Instructed him to reach out to psychiatrist if SI return.  Continue to follow up with psychiatry.  Follow up in 3 months.  Call sooner if concerns arise.       Relevant Medications   sertraline (ZOLOFT) 50 MG tablet     Follow up plan: Return in about 3 months (around 10/05/2022) for Depression/Anxiety FU.   This visit was completed via MyChart due to the restrictions of the COVID-19 pandemic. All issues as above were discussed and addressed. Physical exam was done as above through visual confirmation on MyChart. If it was felt that the patient should be evaluated in the office, they were directed  there. The patient verbally consented to this visit. Location of the patient: Home Location of the provider: Office Those involved with this call:  Provider: Larae Grooms, NP CMA: Anitra Lauth, CMA Front Desk/Registration: Servando Snare This encounter was conducted via video.  I spent 20 dedicated to the care of this patient on the date of this encounter to include previsit review of symptoms, plan of care and follow up, face to face time with the patient, and post visit ordering of testing.

## 2022-07-05 NOTE — Assessment & Plan Note (Signed)
Chronic.  Not well controlled.  Has some thoughts of suicide before starting the Sertraline.  States he hasn't had them since starting the medication.  Instructed him to reach out to psychiatrist if SI return.  Continue to follow up with psychiatry.  Follow up in 3 months.  Call sooner if concerns arise.

## 2022-07-05 NOTE — Progress Notes (Signed)
LVM asking patient to call back to schedule his follow up appt

## 2022-10-07 ENCOUNTER — Ambulatory Visit: Payer: 59 | Admitting: Nurse Practitioner

## 2022-10-09 ENCOUNTER — Encounter: Payer: Self-pay | Admitting: Nurse Practitioner

## 2022-10-09 ENCOUNTER — Telehealth (INDEPENDENT_AMBULATORY_CARE_PROVIDER_SITE_OTHER): Payer: 59 | Admitting: Nurse Practitioner

## 2022-10-09 DIAGNOSIS — F4310 Post-traumatic stress disorder, unspecified: Secondary | ICD-10-CM | POA: Diagnosis not present

## 2022-10-09 DIAGNOSIS — F419 Anxiety disorder, unspecified: Secondary | ICD-10-CM

## 2022-10-09 DIAGNOSIS — F320 Major depressive disorder, single episode, mild: Secondary | ICD-10-CM | POA: Diagnosis not present

## 2022-10-09 NOTE — Progress Notes (Signed)
There were no vitals taken for this visit.   Subjective:    Patient ID: Cole Riley, male    DOB: 1996-02-08, 26 y.o.   MRN: [id number]  HPI: Cole Riley is a 26 y.o. male  Chief Complaint  Patient presents with   Anxiety   Depression   ANXIETY/DEPRESSION Patient states things have been going pretty well.  He is currently taking Sertraline and Abilify.   States he hasn't been given the diagnosis of Bipolar 2 but don't have enough evidence for the full diagnosis.  Patient is concerned that his testosterone might be low.  Would like to come into the office and get it checked.  Denies SI.  Flowsheet Row Video Visit from 10/09/2022 in [facility]  PHQ-9 Total Score 13         10/09/2022    4:18 PM 07/05/2022    4:01 PM 06/05/2022    4:27 PM 05/17/2022    3:49 PM  GAD 7 : Generalized Anxiety Score  Nervous, Anxious, on Edge 2 2 2 1   Control/stop worrying 2 3 1 1   Worry too much - different things 2 2 1  0  Trouble relaxing 1 2 2 1   Restless 1 2 2 1   Easily annoyed or irritable 1 1 2 2   Afraid - awful might happen 2 2 1 1   Total GAD 7 Score 11 14 11 7   Anxiety Difficulty Somewhat difficult Somewhat difficult Somewhat difficult Somewhat difficult     Relevant past medical, surgical, family and social history reviewed and updated as indicated. Interim medical history since our last visit reviewed. Allergies and medications reviewed and updated.  Review of Systems  Psychiatric/Behavioral:  Positive for dysphoric mood. Negative for suicidal ideas. The patient is nervous/anxious.     Per HPI unless specifically indicated above     Objective:    There were no vitals taken for this visit.  Wt Readings from Last 3 Encounters:  05/17/22 175 lb 9.6 oz (79.7 kg)  03/12/22 169 lb (76.7 kg)  11/16/21 160 lb (72.6 kg)    Physical Exam Vitals and nursing note reviewed.  Constitutional:      General: He is not in acute distress.    Appearance: He is not  ill-appearing.  HENT:     Head: Normocephalic.     Right Ear: Hearing normal.     Left Ear: Hearing normal.     Nose: Nose normal.  Pulmonary:     Effort: Pulmonary effort is normal. No respiratory distress.  Neurological:     Mental Status: He is alert.  Psychiatric:        Mood and Affect: Mood normal.        Behavior: Behavior normal.        Thought Content: Thought content normal.        Judgment: Judgment normal.     Results for orders placed or performed in visit on 11/16/21  Celiac Disease Panel  Result Value Ref Range   Endomysial IgA Negative Negative   Transglutaminase IgA <2 0 - 3 U/mL   IgA/Immunoglobulin A, Serum 198 90 - 386 mg/dL      Assessment & Plan:   Problem List Items Addressed This Visit       Other   Depression, major, single episode, mild ([place]) - Primary    Chronic.  Improving but still working on medication regimen with Psychiatrist.  Lucita Lora out possibility of Bipolar 2.  Patient currently on Sertraline 100mg   and Abilify 5mg .  Continue with therapy and follow up with psychiatry.        Relevant Medications   sertraline (ZOLOFT) 100 MG tablet   traZODone (DESYREL) 50 MG tablet   Anxiety    Chronic.  Improving but still working on medication regimen with Psychiatrist.  Lucita Lora out possibility of Bipolar 2.  Patient currently on Sertraline 100mg  and Abilify 5mg .  Continue with therapy and follow up with psychiatry.        Relevant Medications   sertraline (ZOLOFT) 100 MG tablet   traZODone (DESYREL) 50 MG tablet   PTSD (post-traumatic stress disorder)    Chronic.  Improving but still working on medication regimen with Psychiatrist.  Lucita Lora out possibility of Bipolar 2.  Patient currently on Sertraline 100mg  and Abilify 5mg .  Continue with therapy and follow up with psychiatry.        Relevant Medications   sertraline (ZOLOFT) 100 MG tablet   traZODone (DESYREL) 50 MG tablet     Follow up plan: Return in about 1 month (around 11/09/2022) for  Depression/Anxiety FU.    This visit was completed via MyChart due to the restrictions of the COVID-19 pandemic. All issues as above were discussed and addressed. Physical exam was done as above through visual confirmation on MyChart. If it was felt that the patient should be evaluated in the office, they were directed there. The patient verbally consented to this visit. Location of the patient: Home Location of the provider: Office Those involved with this call:  Provider: Jon Billings, NP CMA: Valinda Hoar, [place] Desk/Registration: Lynnell Catalan This encounter was conducted via video.  I spent 20 dedicated to the care of this patient on the date of this encounter to include previsit review of symptoms, medications, follow up, face to face time with the patient, and post visit ordering of testing.

## 2022-10-10 NOTE — Assessment & Plan Note (Signed)
Chronic.  Improving but still working on medication regimen with Psychiatrist.  Cole Riley out possibility of Bipolar 2.  Patient currently on Sertraline 100mg  and Abilify 5mg .  Continue with therapy and follow up with psychiatry.

## 2022-10-10 NOTE — Assessment & Plan Note (Signed)
Chronic.  Improving but still working on medication regimen with Psychiatrist.  Ruling out possibility of Bipolar 2.  Patient currently on Sertraline 100mg and Abilify 5mg.  Continue with therapy and follow up with psychiatry.   

## 2023-04-10 ENCOUNTER — Telehealth (INDEPENDENT_AMBULATORY_CARE_PROVIDER_SITE_OTHER): Payer: BC Managed Care – PPO | Admitting: Nurse Practitioner

## 2023-04-10 ENCOUNTER — Encounter: Payer: Self-pay | Admitting: Nurse Practitioner

## 2023-04-10 DIAGNOSIS — R5383 Other fatigue: Secondary | ICD-10-CM

## 2023-04-10 NOTE — Progress Notes (Signed)
There were no vitals taken for this visit.   Subjective:    Patient ID: Cole Riley, male    DOB: 17-Oct-1996, 27 y.o.   MRN: [id number]  HPI: Cole Riley is a 27 y.o. male  Chief Complaint  Patient presents with   Fatigue   FATIGUE Duration:  months Severity: 8/10  Onset: gradual Context when symptoms started:  unknown Symptoms improve with rest: no  Depressive symptoms:  sometimes Stress/anxiety: yes Insomnia: yes hard to stay asleep Snoring: no Observed apnea by bed partner: no Daytime hypersomnolence:yes Wakes feeling refreshed: no History of sleep study: no Dysnea on exertion:   sometimes Orthopnea/PND: no Chest pain: no Chronic cough: no Lower extremity edema: no Arthralgias:no Myalgias: yes- after workouts Weakness: no Rash: no  Relevant past medical, surgical, family and social history reviewed and updated as indicated. Interim medical history since our last visit reviewed. Allergies and medications reviewed and updated.  Review of Systems  Constitutional:  Positive for fatigue.  Respiratory:  Negative for cough and shortness of breath.   Cardiovascular:  Negative for chest pain.  Musculoskeletal:  Positive for myalgias. Negative for arthralgias.  Psychiatric/Behavioral:  Positive for dysphoric mood and sleep disturbance. The patient is nervous/anxious.     Per HPI unless specifically indicated above     Objective:    There were no vitals taken for this visit.  Wt Readings from Last 3 Encounters:  05/17/22 175 lb 9.6 oz (79.7 kg)  03/12/22 169 lb (76.7 kg)  11/16/21 160 lb (72.6 kg)    Physical Exam Vitals and nursing note reviewed.  Constitutional:      General: He is not in acute distress.    Appearance: He is not ill-appearing.  HENT:     Head: Normocephalic.     Right Ear: Hearing normal.     Left Ear: Hearing normal.     Nose: Nose normal.  Pulmonary:     Effort: Pulmonary effort is normal. No respiratory distress.   Neurological:     Mental Status: He is alert.  Psychiatric:        Mood and Affect: Mood normal.        Behavior: Behavior normal.        Thought Content: Thought content normal.        Judgment: Judgment normal.     Results for orders placed or performed in visit on 11/16/21  Celiac Disease Panel  Result Value Ref Range   Endomysial IgA Negative Negative   Transglutaminase IgA <2 0 - 3 U/mL   IgA/Immunoglobulin A, Serum 198 90 - 386 mg/dL      Assessment & Plan:   Problem List Items Addressed This Visit   None Visit Diagnoses     Other fatigue    -  Primary   Will check labs including Vit D, B12, CBC, Iron studies, and thyroid. Will make recommendations based on lab results.   Relevant Orders   CBC w/Diff   Thyroid Panel With TSH   Thyroid peroxidase antibody   Vitamin D (25 hydroxy)   B12   Testosterone, free, total([facility])        Follow up plan: Return for Will follow up after lab work.   This visit was completed via MyChart due to the restrictions of the COVID-19 pandemic. All issues as above were discussed and addressed. Physical exam was done as above through visual confirmation on MyChart. If it was felt that the patient should be evaluated in the  office, they were directed there. The patient verbally consented to this visit. Location of the patient: Home Location of the provider: Office Those involved with this call:  Provider: Larae Grooms, NP CMA: Lauris Chroman, CMA Front Desk/Registration: Servando Snare This encounter was conducted via video.  I spent 20 dedicated to the care of this patient on the date of this encounter to include previsit review of symptoms, plan of care and follow up, face to face time with the patient, and post visit ordering of testing.

## 2023-04-14 ENCOUNTER — Other Ambulatory Visit: Payer: BC Managed Care – PPO

## 2023-04-14 DIAGNOSIS — E559 Vitamin D deficiency, unspecified: Secondary | ICD-10-CM

## 2023-04-14 DIAGNOSIS — R5383 Other fatigue: Secondary | ICD-10-CM

## 2023-04-15 LAB — CBC WITH DIFFERENTIAL/PLATELET
Basophils Absolute: 0 10*3/uL (ref 0.0–0.2)
EOS (ABSOLUTE): 0.2 10*3/uL (ref 0.0–0.4)
Eos: 3 %
Immature Grans (Abs): 0.1 10*3/uL (ref 0.0–0.1)
MCH: 30.3 pg (ref 26.6–33.0)
Neutrophils: 58 %
Platelets: 241 10*3/uL (ref 150–450)
WBC: 6.3 10*3/uL (ref 3.4–10.8)

## 2023-04-15 LAB — THYROID PEROXIDASE ANTIBODY: Thyroperoxidase Ab SerPl-aCnc: <9 IU/mL (ref 0–34)

## 2023-04-15 MED ORDER — CHOLECALCIFEROL 1.25 MG (50000 UT) PO TABS
1.0000 | ORAL_TABLET | ORAL | 0 refills | Status: DC
Start: 2023-04-15 — End: 2023-11-19

## 2023-04-15 NOTE — Progress Notes (Signed)
Hi Cole Riley. Your lab work shows that your vitamin D is low.  I recommend starting Vitamin D 50,000 IU once weekly.  This is a prescription I have sent in for you.  You will take it for 12 weeks then starting taking Vitamin D 2000 IU once daily.    Otherwise, your lab work looks good.  No other concerns at this time.

## 2023-04-17 LAB — CBC WITH DIFFERENTIAL/PLATELET
Basos: 0 %
Hematocrit: 48 % (ref 37.5–51.0)
Hemoglobin: 15.9 g/dL (ref 13.0–17.7)
Immature Granulocytes: 1 %
Lymphocytes Absolute: 1.9 10*3/uL (ref 0.7–3.1)
Lymphs: 31 %
MCHC: 33.1 g/dL (ref 31.5–35.7)
MCV: 91 fL (ref 79–97)
Monocytes Absolute: 0.5 10*3/uL (ref 0.1–0.9)
Monocytes: 7 %
Neutrophils Absolute: 3.7 10*3/uL (ref 1.4–7.0)
RBC: 5.25 x10E6/uL (ref 4.14–5.80)
RDW: 12.7 % (ref 11.6–15.4)

## 2023-04-17 LAB — VITAMIN B12: Vitamin B-12: 499 pg/mL (ref 232–1245)

## 2023-04-17 LAB — TESTOSTERONE, FREE, TOTAL, SHBG
Sex Hormone Binding: 22.4 nmol/L (ref 16.5–55.9)
Testosterone, Free: 15.2 pg/mL (ref 9.3–26.5)
Testosterone: 484 ng/dL (ref 264–916)

## 2023-04-17 LAB — THYROID PANEL WITH TSH
Free Thyroxine Index: 2 (ref 1.2–4.9)
T3 Uptake Ratio: 29 % (ref 24–39)
T4, Total: 7 ug/dL (ref 4.5–12.0)
TSH: 0.847 u[IU]/mL (ref 0.450–4.500)

## 2023-04-17 LAB — VITAMIN D 25 HYDROXY (VIT D DEFICIENCY, FRACTURES): Vit D, 25-Hydroxy: 23.4 ng/mL — ABNORMAL LOW (ref 30.0–100.0)

## 2023-05-05 ENCOUNTER — Encounter: Payer: Self-pay | Admitting: Nurse Practitioner

## 2023-11-18 NOTE — Progress Notes (Unsigned)
   There were no vitals taken for this visit.   Subjective:    Patient ID: Cole Riley, male    DOB: Mar 15, 1996, 27 y.o.   MRN: [id number]  HPI: Cole Riley is a 27 y.o. male  No chief complaint on file.   Relevant past medical, surgical, family and social history reviewed and updated as indicated. Interim medical history since our last visit reviewed. Allergies and medications reviewed and updated.  Review of Systems  Per HPI unless specifically indicated above     Objective:    There were no vitals taken for this visit.  Wt Readings from Last 3 Encounters:  05/17/22 175 lb 9.6 oz (79.7 kg)  03/12/22 169 lb (76.7 kg)  11/16/21 160 lb (72.6 kg)    Physical Exam  Results for orders placed or performed in visit on 04/14/23  Testosterone, free, total([facility])  Result Value Ref Range   Testosterone 484 264 - 916 ng/dL   Testosterone, Free 40.9 9.3 - 26.5 pg/mL   Sex Hormone Binding 22.4 16.5 - 55.9 nmol/L  B12  Result Value Ref Range   Vitamin B-12 499 232 - 1,245 pg/mL  Vitamin D (25 hydroxy)  Result Value Ref Range   Vit D, 25-Hydroxy 23.4 (L) 30.0 - 100.0 ng/mL  Thyroid peroxidase antibody  Result Value Ref Range   Thyroperoxidase Ab SerPl-aCnc <9 0 - 34 IU/mL  Thyroid Panel With TSH  Result Value Ref Range   TSH 0.847 0.450 - 4.500 uIU/mL   T4, Total 7.0 4.5 - 12.0 ug/dL   T3 Uptake Ratio 29 24 - 39 %   Free Thyroxine Index 2.0 1.2 - 4.9  CBC w/Diff  Result Value Ref Range   WBC 6.3 3.4 - 10.8 x10E3/uL   RBC 5.25 4.14 - 5.80 x10E6/uL   Hemoglobin 15.9 13.0 - 17.7 g/dL   Hematocrit 81.1 91.4 - 51.0 %   MCV 91 79 - 97 fL   MCH 30.3 26.6 - 33.0 pg   MCHC 33.1 31.5 - 35.7 g/dL   RDW 78.2 95.6 - 21.3 %   Platelets 241 150 - 450 x10E3/uL   Neutrophils 58 Not Estab. %   Lymphs 31 Not Estab. %   Monocytes 7 Not Estab. %   Eos 3 Not Estab. %   Basos 0 Not Estab. %   Neutrophils Absolute 3.7 1.4 - 7.0 x10E3/uL   Lymphocytes Absolute 1.9 0.7  - 3.1 x10E3/uL   Monocytes Absolute 0.5 0.1 - 0.9 x10E3/uL   EOS (ABSOLUTE) 0.2 0.0 - 0.4 x10E3/uL   Basophils Absolute 0.0 0.0 - 0.2 x10E3/uL   Immature Granulocytes 1 Not Estab. %   Immature Grans (Abs) 0.1 0.0 - 0.1 x10E3/uL      Assessment & Plan:   Problem List Items Addressed This Visit   None    Follow up plan: No follow-ups on file.

## 2023-11-19 ENCOUNTER — Ambulatory Visit: Payer: BC Managed Care – PPO | Admitting: Nurse Practitioner

## 2023-11-19 ENCOUNTER — Encounter: Payer: Self-pay | Admitting: Nurse Practitioner

## 2023-11-19 VITALS — BP 120/78 | HR 80 | Temp 97.6°F | Ht 72.0 in | Wt 217.6 lb

## 2023-11-19 DIAGNOSIS — F419 Anxiety disorder, unspecified: Secondary | ICD-10-CM

## 2023-11-20 NOTE — Assessment & Plan Note (Signed)
Chronic.  Followed by Psychiatry.  He is now on Geodon and Zoloft.  Psychiatrist is requesting an EKG.  Qtc was 378.  Continue with current medication regimen and follow up wit psychiatrist.  Follow up in 1 month for physical.

## 2023-11-22 LAB — LAB REPORT - SCANNED: A1c: 5.3

## 2023-12-19 ENCOUNTER — Encounter: Payer: Self-pay | Admitting: Nurse Practitioner

## 2023-12-19 ENCOUNTER — Ambulatory Visit: Payer: BC Managed Care – PPO | Attending: Nurse Practitioner

## 2023-12-19 ENCOUNTER — Ambulatory Visit: Payer: BC Managed Care – PPO | Admitting: Nurse Practitioner

## 2023-12-19 VITALS — BP 126/72 | HR 100 | Temp 97.2°F | Ht 72.5 in | Wt 222.8 lb

## 2023-12-19 DIAGNOSIS — R42 Dizziness and giddiness: Secondary | ICD-10-CM | POA: Insufficient documentation

## 2023-12-19 DIAGNOSIS — R002 Palpitations: Secondary | ICD-10-CM | POA: Diagnosis not present

## 2023-12-19 DIAGNOSIS — F419 Anxiety disorder, unspecified: Secondary | ICD-10-CM | POA: Diagnosis not present

## 2023-12-19 NOTE — Progress Notes (Signed)
BP 126/72 (BP Location: Right Arm, Patient Position: Sitting, Cuff Size: Normal)   Pulse 100   Temp (!) 97.2 F (36.2 C) (Oral)   Ht 6' 0.5" (1.842 m)   Wt 222 lb 12.8 oz (101.1 kg)   SpO2 96%   BMI 29.80 kg/m    Subjective:    Patient ID: Cole Riley, male    DOB: 1996-01-31, 27 y.o.   MRN: [id number]  HPI: CALAN OTANEZ is a 27 y.o. male  Chief Complaint  Patient presents with   Dizziness   Patient presents to clinic with complaints of lightheadedness when he works out intensely.  For the last few weeks pretty much every time he works out he feels faint and has to lay down for 10-36mins and symptoms resolve.  He states he is eating plenty before his work outs.  He is drinking about 4-6 cups per day.  He is not drinking pre workout.  He is taking an omega 3, vitamin D 3, Ca, mg zinc, multivitamin and B12.  He is also taking creatine. He started taking creatine about 2 weeks ago.  He is mostly weight lifting.  He does have some palpitations.  Relevant past medical, surgical, family and social history reviewed and updated as indicated. Interim medical history since our last visit reviewed. Allergies and medications reviewed and updated.  Review of Systems  Constitutional:  Positive for fatigue.  Respiratory:  Negative for shortness of breath.   Cardiovascular:  Positive for palpitations. Negative for chest pain.  Neurological:  Positive for light-headedness.    Per HPI unless specifically indicated above     Objective:    BP 126/72 (BP Location: Right Arm, Patient Position: Sitting, Cuff Size: Normal)   Pulse 100   Temp (!) 97.2 F (36.2 C) (Oral)   Ht 6' 0.5" (1.842 m)   Wt 222 lb 12.8 oz (101.1 kg)   SpO2 96%   BMI 29.80 kg/m   Wt Readings from Last 3 Encounters:  12/19/23 222 lb 12.8 oz (101.1 kg)  11/19/23 217 lb 9.6 oz (98.7 kg)  05/17/22 175 lb 9.6 oz (79.7 kg)    Physical Exam Vitals and nursing note reviewed.  Constitutional:      General: He  is not in acute distress.    Appearance: Normal appearance. He is not ill-appearing, toxic-appearing or diaphoretic.  HENT:     Head: Normocephalic.     Right Ear: External ear normal.     Left Ear: External ear normal.     Nose: Nose normal. No congestion or rhinorrhea.     Mouth/Throat:     Mouth: Mucous membranes are moist.  Eyes:     General:        Right eye: No discharge.        Left eye: No discharge.     Extraocular Movements: Extraocular movements intact.     Conjunctiva/sclera: Conjunctivae normal.     Pupils: Pupils are equal, round, and reactive to light.  Cardiovascular:     Rate and Rhythm: Normal rate and regular rhythm.     Heart sounds: No murmur heard. Pulmonary:     Effort: Pulmonary effort is normal. No respiratory distress.     Breath sounds: Normal breath sounds. No wheezing, rhonchi or rales.  Abdominal:     General: Abdomen is flat. Bowel sounds are normal.  Musculoskeletal:     Cervical back: Normal range of motion and neck supple.  Skin:    General: Skin  is warm and dry.     Capillary Refill: Capillary refill takes less than 2 seconds.  Neurological:     General: No focal deficit present.     Mental Status: He is alert and oriented to person, place, and time.  Psychiatric:        Mood and Affect: Mood normal.        Behavior: Behavior normal.        Thought Content: Thought content normal.        Judgment: Judgment normal.     Results for orders placed or performed in visit on 12/03/23  Lab report - scanned   Collection Time: 11/22/23  3:30 PM  Result Value Ref Range   A1c 5.3%       Assessment & Plan:   Problem List Items Addressed This Visit       Other   Anxiety   Relevant Orders   EKG 12-Lead   Comp Met (CMET)   Lightheadedness - Primary   Happening with with workouts.  Recommend increasing water intake. EKG done in office today- NSR with Qtc of 438.  CMP checked.  ZIO monitor ordered during visit.  If results are unremarkable,  will refer to Cardiology.       Other Visit Diagnoses       Palpitations       Relevant Orders   LONG TERM MONITOR (3-14 DAYS)        Follow up plan: Return in about 1 month (around 01/19/2024) for Lightheadedness .   A total of 30 minutes were spent on this encounter today.  When total time is documented, this includes both the face-to-face and non-face-to-face time personally spent before, during and after the visit on the date of the encounter reviewing symptoms, EKG results and plan of care.

## 2023-12-19 NOTE — Assessment & Plan Note (Signed)
Happening with with workouts.  Recommend increasing water intake. EKG done in office today- NSR with Qtc of 438.  CMP checked.  ZIO monitor ordered during visit.  If results are unremarkable, will refer to Cardiology.

## 2023-12-22 ENCOUNTER — Other Ambulatory Visit: Payer: BC Managed Care – PPO

## 2023-12-22 ENCOUNTER — Ambulatory Visit: Payer: BC Managed Care – PPO | Admitting: Nurse Practitioner

## 2023-12-22 DIAGNOSIS — F419 Anxiety disorder, unspecified: Secondary | ICD-10-CM

## 2023-12-22 NOTE — Progress Notes (Unsigned)
Order

## 2023-12-23 LAB — COMPREHENSIVE METABOLIC PANEL
ALT: 57 [IU]/L — ABNORMAL HIGH (ref 0–44)
AST: 35 [IU]/L (ref 0–40)
Albumin: 4.7 g/dL (ref 4.3–5.2)
Alkaline Phosphatase: 72 [IU]/L (ref 44–121)
BUN/Creatinine Ratio: 14 (ref 9–20)
BUN: 15 mg/dL (ref 6–20)
Bilirubin Total: 0.7 mg/dL (ref 0.0–1.2)
CO2: 22 mmol/L (ref 20–29)
Calcium: 9.4 mg/dL (ref 8.7–10.2)
Chloride: 103 mmol/L (ref 96–106)
Creatinine, Ser: 1.09 mg/dL (ref 0.76–1.27)
Globulin, Total: 2.1 g/dL (ref 1.5–4.5)
Glucose: 88 mg/dL (ref 70–99)
Potassium: 4.2 mmol/L (ref 3.5–5.2)
Sodium: 142 mmol/L (ref 134–144)
Total Protein: 6.8 g/dL (ref 6.0–8.5)
eGFR: 95 mL/min/{1.73_m2} (ref >59)

## 2023-12-24 DIAGNOSIS — R002 Palpitations: Secondary | ICD-10-CM | POA: Diagnosis not present

## 2023-12-30 ENCOUNTER — Ambulatory Visit (INDEPENDENT_AMBULATORY_CARE_PROVIDER_SITE_OTHER): Payer: BC Managed Care – PPO | Admitting: Nurse Practitioner

## 2023-12-30 ENCOUNTER — Encounter: Payer: Self-pay | Admitting: Nurse Practitioner

## 2023-12-30 VITALS — BP 120/81 | HR 88 | Temp 97.9°F | Ht 72.5 in | Wt 225.2 lb

## 2023-12-30 DIAGNOSIS — Z136 Encounter for screening for cardiovascular disorders: Secondary | ICD-10-CM | POA: Diagnosis not present

## 2023-12-30 DIAGNOSIS — Z23 Encounter for immunization: Secondary | ICD-10-CM | POA: Diagnosis not present

## 2023-12-30 DIAGNOSIS — Z0000 Encounter for general adult medical examination without abnormal findings: Secondary | ICD-10-CM

## 2023-12-30 LAB — URINALYSIS, ROUTINE W REFLEX MICROSCOPIC
Bilirubin, UA: NEGATIVE
Glucose, UA: NEGATIVE
Ketones, UA: NEGATIVE
Leukocytes,UA: NEGATIVE
Nitrite, UA: NEGATIVE
Protein,UA: NEGATIVE
RBC, UA: NEGATIVE
Specific Gravity, UA: 1.025 (ref 1.005–1.030)
Urobilinogen, Ur: 0.2 mg/dL (ref 0.2–1.0)
pH, UA: 7 (ref 5.0–7.5)

## 2023-12-30 NOTE — Progress Notes (Signed)
 BP 120/81 (BP Location: Left Arm, Patient Position: Sitting, Cuff Size: Normal)   Pulse 88   Temp 97.9 F (36.6 C) (Oral)   Ht 6' 0.5 (1.842 m)   Wt 225 lb 3.2 oz (102.2 kg)   SpO2 98%   BMI 30.12 kg/m    Subjective:    Patient ID: Cole Riley, male    DOB: 11-Feb-1996, 27 y.o.   MRN: [id number]  HPI: Cole Riley is a 27 y.o. male presenting on 12/30/2023 for comprehensive medical examination. Current medical complaints include:none  He currently lives with: Interim Problems from his last visit: no  Depression Screen done today and results listed below:     12/30/2023    8:48 AM 12/19/2023   10:29 AM 11/19/2023    4:08 PM 04/10/2023    4:28 PM 10/09/2022    4:16 PM  Depression screen PHQ 2/9  Decreased Interest 2 2 2 2 2   Down, Depressed, Hopeless 2 2 2 2 1   PHQ - 2 Score 4 4 4 4 3   Altered sleeping 1 3 2 2 2   Tired, decreased energy 1 1 2 3 3   Change in appetite 2 2 1  0 1  Feeling bad or failure about yourself  2 2 1 1 1   Trouble concentrating 0 0 0 0 2  Moving slowly or fidgety/restless 0 0 0 0 0  Suicidal thoughts 1 1 2  0 1  PHQ-9 Score 11 13 12 10 13   Difficult doing work/chores  Somewhat difficult  Somewhat difficult Somewhat difficult    The patient does not have a history of falls. I did complete a risk assessment for falls. A plan of care for falls was documented.   Past Medical History:  Past Medical History:  Diagnosis Date   Anxiety    Depression    GERD (gastroesophageal reflux disease)     Surgical History:  Past Surgical History:  Procedure Laterality Date   fenulectomy      Medications:  Current Outpatient Medications on File Prior to Visit  Medication Sig   ARIPiprazole  (ABILIFY ) 5 MG tablet Take 5 mg by mouth daily.   lamoTRIgine (LAMICTAL) 25 MG tablet Take 25 mg by mouth daily. Take 2x daily (50 mg total)   sertraline (ZOLOFT) 100 MG tablet Take 100 mg by mouth daily.   No current facility-administered medications on  file prior to visit.    Allergies:  Allergies  Allergen Reactions   Wellbutrin  [Bupropion ] Other (See Comments)    Cause seizures     Social History:  Social History   Socioeconomic History   Marital status: Single    Spouse name: Not on file   Number of children: Not on file   Years of education: Not on file   Highest education level: Bachelor's degree (e.g., BA, AB, BS)  Occupational History   Not on file  Tobacco Use   Smoking status: Never   Smokeless tobacco: Never  Vaping Use   Vaping status: Never Used  Substance and Sexual Activity   Alcohol use: Not Currently   Drug use: No   Sexual activity: Yes    Birth control/protection: None  Other Topics Concern   Not on file  Social History Narrative   Not on file   Social Drivers of Health   Financial Resource Strain: Medium Risk (12/18/2023)   Overall Financial Resource Strain (CARDIA)    Difficulty of Paying Living Expenses: Somewhat hard  Food Insecurity: Food Insecurity Present (12/18/2023)  Hunger Vital Sign    Worried About Running Out of Food in the Last Year: Sometimes true    Ran Out of Food in the Last Year: Sometimes true  Transportation Needs: No Transportation Needs (12/18/2023)   PRAPARE - Administrator, Civil Service (Medical): No    Lack of Transportation (Non-Medical): No  Physical Activity: Sufficiently Active (12/18/2023)   Exercise Vital Sign    Days of Exercise per Week: 6 days    Minutes of Exercise per Session: 50 min  Stress: Stress Concern Present (12/18/2023)   [employer] of Occupational Health - Occupational Stress Questionnaire    Feeling of Stress : To some extent  Social Connections: Socially Isolated (12/18/2023)   Social Connection and Isolation Panel [NHANES]    Frequency of Communication with Friends and Family: Twice a week    Frequency of Social Gatherings with Friends and Family: Once a week    Attends Religious Services: Never    Doctor, General Practice or Organizations: No    Attends Engineer, Structural: Not on file    Marital Status: Never married  Catering Manager Violence: Not on file   Social History   Tobacco Use  Smoking Status Never  Smokeless Tobacco Never   Social History   Substance and Sexual Activity  Alcohol Use Not Currently    Family History:  Family History  Problem Relation Age of Onset   Sarcoidosis Father    Heart disease Maternal Grandmother    Diabetes Maternal Grandmother    Hypertension Maternal Grandmother    Stroke Maternal Grandfather    Hyperlipidemia Maternal Grandfather    Colon cancer Paternal Grandfather    Cancer Paternal Grandfather        brain    Past medical history, surgical history, medications, allergies, family history and social history reviewed with patient today and changes made to appropriate areas of the chart.   Review of Systems  All other systems reviewed and are negative.  All other ROS negative except what is listed above and in the HPI.      Objective:    BP 120/81 (BP Location: Left Arm, Patient Position: Sitting, Cuff Size: Normal)   Pulse 88   Temp 97.9 F (36.6 C) (Oral)   Ht 6' 0.5 (1.842 m)   Wt 225 lb 3.2 oz (102.2 kg)   SpO2 98%   BMI 30.12 kg/m   Wt Readings from Last 3 Encounters:  12/30/23 225 lb 3.2 oz (102.2 kg)  12/19/23 222 lb 12.8 oz (101.1 kg)  11/19/23 217 lb 9.6 oz (98.7 kg)    Physical Exam Vitals and nursing note reviewed.  Constitutional:      General: He is not in acute distress.    Appearance: Normal appearance. He is not ill-appearing, toxic-appearing or diaphoretic.  HENT:     Head: Normocephalic.     Right Ear: Tympanic membrane, ear canal and external ear normal.     Left Ear: Tympanic membrane, ear canal and external ear normal.     Nose: Nose normal. No congestion or rhinorrhea.     Mouth/Throat:     Mouth: Mucous membranes are moist.  Eyes:     General:        Right eye: No discharge.        Left  eye: No discharge.     Extraocular Movements: Extraocular movements intact.     Conjunctiva/sclera: Conjunctivae normal.     Pupils: Pupils are equal, round,  and reactive to light.  Cardiovascular:     Rate and Rhythm: Normal rate and regular rhythm.     Heart sounds: No murmur heard. Pulmonary:     Effort: Pulmonary effort is normal. No respiratory distress.     Breath sounds: Normal breath sounds. No wheezing, rhonchi or rales.  Abdominal:     General: Abdomen is flat. Bowel sounds are normal. There is no distension.     Palpations: Abdomen is soft.     Tenderness: There is no abdominal tenderness. There is no guarding.  Musculoskeletal:     Cervical back: Normal range of motion and neck supple.  Skin:    General: Skin is warm and dry.     Capillary Refill: Capillary refill takes less than 2 seconds.  Neurological:     General: No focal deficit present.     Mental Status: He is alert and oriented to person, place, and time.     Cranial Nerves: No cranial nerve deficit.     Motor: No weakness.     Deep Tendon Reflexes: Reflexes normal.  Psychiatric:        Mood and Affect: Mood normal.        Behavior: Behavior normal.        Thought Content: Thought content normal.        Judgment: Judgment normal.     Results for orders placed or performed in visit on 12/22/23  Comp Met (CMET)   Collection Time: 12/22/23 11:05 AM  Result Value Ref Range   Glucose 88 70 - 99 mg/dL   BUN 15 6 - 20 mg/dL   Creatinine, Ser 8.90 0.76 - 1.27 mg/dL   eGFR 95 >40 fO/fpw/8.26   BUN/Creatinine Ratio 14 9 - 20   Sodium 142 134 - 144 mmol/L   Potassium 4.2 3.5 - 5.2 mmol/L   Chloride 103 96 - 106 mmol/L   CO2 22 20 - 29 mmol/L   Calcium 9.4 8.7 - 10.2 mg/dL   Total Protein 6.8 6.0 - 8.5 g/dL   Albumin 4.7 4.3 - 5.2 g/dL   Globulin, Total 2.1 1.5 - 4.5 g/dL   Bilirubin Total 0.7 0.0 - 1.2 mg/dL   Alkaline Phosphatase 72 44 - 121 IU/L   AST 35 0 - 40 IU/L   ALT 57 (H) 0 - 44 IU/L       Assessment & Plan:   Problem List Items Addressed This Visit   None Visit Diagnoses       Annual physical exam    -  Primary   Health maintenance reviewed during visit today.  Labs ordered.  Vaccines reviewed.   Relevant Orders   TSH   CBC with Differential/Platelet   Comprehensive metabolic panel   Urinalysis, Routine w reflex microscopic     Screening for ischemic heart disease       Relevant Orders   Lipid Profile     Need for tetanus booster       Relevant Orders   Tdap vaccine greater than or equal to 7yo IM        Discussed aspirin prophylaxis for myocardial infarction prevention and decision was it was not indicated  LABORATORY TESTING:  Health maintenance labs ordered today as discussed above.    IMMUNIZATIONS:   - Tdap: Tetanus vaccination status reviewed: last tetanus booster within 10 years. - Influenza: Up to date - Pneumovax: Not applicable - Prevnar: Not applicable - COVID: Not applicable - HPV: Not applicable - Shingrix vaccine:  Not applicable  SCREENING: - Colonoscopy: Not applicable  Discussed with patient purpose of the colonoscopy is to detect colon cancer at curable precancerous or early stages   - AAA Screening: Not applicable  -Hearing Test: Not applicable  -Spirometry: Not applicable   PATIENT COUNSELING:    Sexuality: Discussed sexually transmitted diseases, partner selection, use of condoms, avoidance of unintended pregnancy  and contraceptive alternatives.   Advised to avoid cigarette smoking.  I discussed with the patient that most people either abstain from alcohol or drink within safe limits (<=14/week and <=4 drinks/occasion for males, <=7/weeks and <= 3 drinks/occasion for females) and that the risk for alcohol disorders and other health effects rises proportionally with the number of drinks per week and how often a drinker exceeds daily limits.  Discussed cessation/primary prevention of drug use and availability of treatment for  abuse.   Diet: Encouraged to adjust caloric intake to maintain  or achieve ideal body weight, to reduce intake of dietary saturated fat and total fat, to limit sodium intake by avoiding high sodium foods and not adding table salt, and to maintain adequate dietary potassium and calcium preferably from fresh fruits, vegetables, and low-fat dairy products.    stressed the importance of regular exercise  Injury prevention: Discussed safety belts, safety helmets, smoke detector, smoking near bedding or upholstery.   Dental health: Discussed importance of regular tooth brushing, flossing, and dental visits.   Follow up plan: NEXT PREVENTATIVE PHYSICAL DUE IN 1 YEAR. No follow-ups on file.

## 2023-12-31 LAB — CBC WITH DIFFERENTIAL/PLATELET
Basophils Absolute: 0 10*3/uL (ref 0.0–0.2)
Basos: 0 %
EOS (ABSOLUTE): 0.2 10*3/uL (ref 0.0–0.4)
Eos: 3 %
Hematocrit: 45.7 % (ref 37.5–51.0)
Hemoglobin: 15.4 g/dL (ref 13.0–17.7)
Immature Grans (Abs): 0 10*3/uL (ref 0.0–0.1)
Immature Granulocytes: 1 %
Lymphocytes Absolute: 2.3 10*3/uL (ref 0.7–3.1)
Lymphs: 39 %
MCH: 30.7 pg (ref 26.6–33.0)
MCHC: 33.7 g/dL (ref 31.5–35.7)
MCV: 91 fL (ref 79–97)
Monocytes Absolute: 0.5 10*3/uL (ref 0.1–0.9)
Monocytes: 9 %
Neutrophils Absolute: 2.8 10*3/uL (ref 1.4–7.0)
Neutrophils: 48 %
Platelets: 250 10*3/uL (ref 150–450)
RBC: 5.01 x10E6/uL (ref 4.14–5.80)
RDW: 13.1 % (ref 11.6–15.4)
WBC: 5.8 10*3/uL (ref 3.4–10.8)

## 2023-12-31 LAB — COMPREHENSIVE METABOLIC PANEL
ALT: 46 [IU]/L — ABNORMAL HIGH (ref 0–44)
AST: 30 [IU]/L (ref 0–40)
Albumin: 4.6 g/dL (ref 4.3–5.2)
Alkaline Phosphatase: 69 [IU]/L (ref 44–121)
BUN/Creatinine Ratio: 12 (ref 9–20)
BUN: 13 mg/dL (ref 6–20)
Bilirubin Total: 0.5 mg/dL (ref 0.0–1.2)
CO2: 23 mmol/L (ref 20–29)
Calcium: 9.5 mg/dL (ref 8.7–10.2)
Chloride: 103 mmol/L (ref 96–106)
Creatinine, Ser: 1.11 mg/dL (ref 0.76–1.27)
Globulin, Total: 2 g/dL (ref 1.5–4.5)
Glucose: 88 mg/dL (ref 70–99)
Potassium: 4.3 mmol/L (ref 3.5–5.2)
Sodium: 141 mmol/L (ref 134–144)
Total Protein: 6.6 g/dL (ref 6.0–8.5)
eGFR: 93 mL/min/{1.73_m2} (ref >59)

## 2023-12-31 LAB — LIPID PANEL
Chol/HDL Ratio: 4.3 {ratio} (ref 0.0–5.0)
Cholesterol, Total: 198 mg/dL (ref 100–199)
HDL: 46 mg/dL (ref >39)
LDL Chol Calc (NIH): 134 mg/dL — ABNORMAL HIGH (ref 0–99)
Triglycerides: 102 mg/dL (ref 0–149)
VLDL Cholesterol Cal: 18 mg/dL (ref 5–40)

## 2023-12-31 LAB — TSH: TSH: 1.09 u[IU]/mL (ref 0.450–4.500)

## 2024-01-16 ENCOUNTER — Ambulatory Visit: Payer: 59 | Admitting: Nurse Practitioner

## 2024-01-22 ENCOUNTER — Other Ambulatory Visit: Payer: Self-pay | Admitting: Nurse Practitioner

## 2024-01-22 ENCOUNTER — Encounter: Payer: Self-pay | Admitting: Nurse Practitioner

## 2024-01-22 DIAGNOSIS — R002 Palpitations: Secondary | ICD-10-CM

## 2024-02-03 DIAGNOSIS — F3181 Bipolar II disorder: Secondary | ICD-10-CM | POA: Insufficient documentation

## 2024-04-01 ENCOUNTER — Ambulatory Visit: Admission: EM | Admit: 2024-04-01 | Discharge: 2024-04-01 | Disposition: A | Discharge status: Home / Self Care

## 2024-04-01 DIAGNOSIS — R21 Rash and other nonspecific skin eruption: Secondary | ICD-10-CM

## 2024-04-01 DIAGNOSIS — L255 Unspecified contact dermatitis due to plants, except food: Secondary | ICD-10-CM | POA: Diagnosis not present

## 2024-04-01 MED ORDER — TRIAMCINOLONE ACETONIDE 0.025 % EX OINT: 1.0000 | TOPICAL_OINTMENT | Freq: Two times a day (BID) | CUTANEOUS | 0 refills | Status: DC

## 2024-04-01 NOTE — ED Triage Notes (Signed)
 Sx x 1 week. Patient states that he has a rash that looks like blisters on right arm,  a small one popped up on left arm today. Patient states that the rash itches.

## 2024-04-01 NOTE — ED Provider Notes (Signed)
 [facility]    CSN: [id number] Arrival date & time: 04/01/24  1603      History   Chief Complaint Chief Complaint  Patient presents with   Rash    HPI Cole Riley is a 28 y.o. male.   28 year old male, Cole Riley, presents to urgent care for evaluation of rash to right arm, blister on right inner forearm, itchy x 1 week. Recently did yard work.  The history is provided by the patient. No language interpreter was used.    Past Medical History:  Diagnosis Date   Anxiety    Depression    GERD (gastroesophageal reflux disease)     Patient Active Problem List   Diagnosis Date Noted   Lightheadedness 12/19/2023   PTSD (post-traumatic stress disorder) 10/09/2022   Anxiety 05/07/2021   Depression, major, single episode, mild (HCC) 03/05/2021   Migraine without aura and without status migrainosus, not intractable 07/21/2017   GERD (gastroesophageal reflux disease) 06/06/2015    Past Surgical History:  Procedure Laterality Date   fenulectomy         Home Medications    Prior to Admission medications   Medication Sig Start Date End Date Taking? Authorizing Provider  sertraline (ZOLOFT) 100 MG tablet Take 100 mg by mouth daily. 09/30/22  Yes [provider]  VRAYLAR 1.5 MG capsule Take 1.5 mg by mouth daily. 02/11/24  Yes [provider]  ARIPiprazole (ABILIFY) 5 MG tablet Take 5 mg by mouth daily.    [provider]  lamoTRIgine (LAMICTAL) 25 MG tablet Take 25 mg by mouth daily. Take 2x daily (50 mg total)    [provider]    Family History Family History  Problem Relation Age of Onset   Sarcoidosis Father    Heart disease Maternal Grandmother    Diabetes Maternal Grandmother    Hypertension Maternal Grandmother    Stroke Maternal Grandfather    Hyperlipidemia Maternal Grandfather    Colon cancer Paternal Grandfather    Cancer Paternal Grandfather        brain    Social History Social History    Tobacco Use   Smoking status: Never   Smokeless tobacco: Never  Vaping Use   Vaping status: Never Used  Substance Use Topics   Alcohol use: Not Currently   Drug use: No     Allergies   Wellbutrin [bupropion]   Review of Systems Review of Systems  Skin:  Positive for rash.  All other systems reviewed and are negative.    Physical Exam Triage Vital Signs ED Triage Vitals  Encounter Vitals Group     BP 04/01/24 1612 129/75     Systolic BP Percentile --      Diastolic BP Percentile --      Pulse Rate 04/01/24 1612 79     Resp 04/01/24 1612 15     Temp 04/01/24 1612 99 F (37.2 C)     Temp Source 04/01/24 1612 Oral     SpO2 04/01/24 1612 96 %     Weight --      Height --      Head Circumference --      Peak Flow --      Pain Score 04/01/24 1611 0     Pain Loc --      Pain Education --      Exclude from Growth Chart --    No data found.  Updated Vital Signs BP 129/75 (BP Location: Right  Arm)   Pulse 79   Temp 99 F (37.2 C) (Oral)   Resp 15   SpO2 96%   Visual Acuity Right Eye Distance:   Left Eye Distance:   Bilateral Distance:    Right Eye Near:   Left Eye Near:    Bilateral Near:     Physical Exam Vitals and nursing note reviewed.  Constitutional:      Appearance: Normal appearance. He is well-developed and well-groomed.  HENT:     Head: Normocephalic.  Cardiovascular:     Rate and Rhythm: Normal rate.  Skin:    Findings: Rash present. Rash is urticarial.     Comments: Right inner forearm linear rash  Neurological:     General: No focal deficit present.     Mental Status: He is alert and oriented to person, place, and time.  Psychiatric:        Attention and Perception: Attention normal.        Mood and Affect: Mood normal.        Speech: Speech normal.        Behavior: Behavior normal. Behavior is cooperative.      UC Treatments / Results  Labs (all labs ordered are listed, but only abnormal results are displayed) Labs  Reviewed - No data to display  EKG   Radiology No results found.  Procedures Procedures (including critical care time)  Medications Ordered in UC Medications - No data to display  Initial Impression / Assessment and Plan / UC Course  I have reviewed the triage vital signs and the nursing notes.  Pertinent labs & imaging results that were available during my care of the patient were reviewed by me and considered in my medical decision making (see chart for details).    Discussed exam findings and plan of care with patient, strict go to ER precautions given.   Patient verbalized understanding to this provider.  Ddx: Rash,contact dermatitis,allergies Final Clinical Impressions(s) / UC Diagnoses   Final diagnoses:  None   Discharge Instructions   None    ED Prescriptions   None    PDMP not reviewed this encounter.   Clancy Gourd, NP 04/01/24 1708

## 2024-04-01 NOTE — Discharge Instructions (Signed)
 Use triamcinolone cream as label directed. Cover with nonstick dressing/bandaid. Avoid heat,hot water as it makes rashes worse. Do not scratch area Return as needed

## 2024-04-28 ENCOUNTER — Encounter: Payer: Self-pay | Admitting: Cardiology

## 2024-04-28 ENCOUNTER — Ambulatory Visit: Payer: Self-pay | Attending: Cardiology | Admitting: Cardiology

## 2024-04-28 VITALS — BP 127/81 | HR 73 | Resp 16 | Ht 72.0 in | Wt 223.8 lb

## 2024-04-28 DIAGNOSIS — T50905D Adverse effect of unspecified drugs, medicaments and biological substances, subsequent encounter: Secondary | ICD-10-CM

## 2024-04-28 DIAGNOSIS — R635 Abnormal weight gain: Secondary | ICD-10-CM | POA: Diagnosis not present

## 2024-04-28 DIAGNOSIS — R42 Dizziness and giddiness: Secondary | ICD-10-CM | POA: Diagnosis not present

## 2024-04-28 NOTE — Progress Notes (Signed)
 Cardiology Office Note:    Date:  04/28/2024   ID:  Cole Riley, DOB 1996-03-22, MRN [id number]  PCP:  Aileen Alexanders, NP   [facility] Providers Cardiologist:  None     Referring MD: Aileen Alexanders, NP   Chief Complaint  Patient presents with   Palpitations   Cole Riley is a 28 y.o. male who is being seen today for the evaluation of palpitations at the request of Aileen Alexanders, NP.   History of Present Illness:    Cole Riley is a 28 y.o. male with a hx of anxiety, bipolar disorder who presents due to dizziness.  States having dizzy spells over the past 6 months or so.  Denies palpitations.  Dizziness usually occurs when he exercises.  Especially standing from seated position.  He started a new psych medication lamotrigine at the time, symptoms improved slightly with stopping medication.  Has also tried hydrating during workouts with some improvement in symptoms.  Denies any history of cardiac disease, denies chest pain, shortness of breath, syncope.  Endorses a 60 pound weight gain after starting Abilify .  This was also stopped.  Not sure if increased weight causes symptoms.  Cardiac monitor was placed 01/20/2024 showing no A-fib, no significant or sustained arrhythmias.  Rare PACs and PVCs.  Patient triggered events associated with sinus rhythm/sinus tachycardia.  Average heart rate 75 bpm  Past Medical History:  Diagnosis Date   Anxiety    Depression    GERD (gastroesophageal reflux disease)     Past Surgical History:  Procedure Laterality Date   fenulectomy      Current Medications: Current Meds  Medication Sig   sertraline (ZOLOFT) 100 MG tablet Take 100 mg by mouth daily.   VRAYLAR 3 MG capsule Take 3 mg by mouth daily.     Allergies:   Wellbutrin  [bupropion ]   Social History   Socioeconomic History   Marital status: Single    Spouse name: Not on file   Number of children: Not on file   Years of education: Not on file    Highest education level: Bachelor's degree (e.g., BA, AB, BS)  Occupational History   Not on file  Tobacco Use   Smoking status: Never   Smokeless tobacco: Never  Vaping Use   Vaping status: Never Used  Substance and Sexual Activity   Alcohol use: Not Currently   Drug use: No   Sexual activity: Yes    Birth control/protection: None  Other Topics Concern   Not on file  Social History Narrative   Not on file   Social Drivers of Health   Financial Resource Strain: Medium Risk (12/18/2023)   Overall Financial Resource Strain (CARDIA)    Difficulty of Paying Living Expenses: Somewhat hard  Food Insecurity: Food Insecurity Present (12/18/2023)   Hunger Vital Sign    Worried About Running Out of Food in the Last Year: Sometimes true    Ran Out of Food in the Last Year: Sometimes true  Transportation Needs: No Transportation Needs (12/18/2023)   PRAPARE - Administrator, Civil Service (Medical): No    Lack of Transportation (Non-Medical): No  Physical Activity: Sufficiently Active (12/18/2023)   Exercise Vital Sign    Days of Exercise per Week: 6 days    Minutes of Exercise per Session: 50 min  Stress: Stress Concern Present (12/18/2023)   [employer] of Occupational Health - Occupational Stress Questionnaire    Feeling of Stress : To some  extent  Social Connections: Socially Isolated (12/18/2023)   Social Connection and Isolation Panel [NHANES]    Frequency of Communication with Friends and Family: Twice a week    Frequency of Social Gatherings with Friends and Family: Once a week    Attends Religious Services: Never    Database administrator or Organizations: No    Attends Engineer, structural: Not on file    Marital Status: Never married     Family History: The patient's family history includes Cancer in his paternal grandfather; Colon cancer in his paternal grandfather; Diabetes in his maternal grandmother; Heart disease in his maternal  grandmother; Hyperlipidemia in his maternal grandfather; Hypertension in his maternal grandmother; Sarcoidosis in his father; Stroke in his maternal grandfather.  ROS:   Please see the history of present illness.     All other systems reviewed and are negative.  EKGs/Labs/Other Studies Reviewed:    The following studies were reviewed today:  EKG Interpretation Date/Time:  Wednesday April 28 2024 08:34:27 EDT Ventricular Rate:  79 PR Interval:  142 QRS Duration:  84 QT Interval:  386 QTC Calculation: 442 R Axis:   81  Text Interpretation: Normal sinus rhythm Normal ECG Confirmed by Constancia Delton ([id number]) on 04/28/2024 8:46:21 AM    Recent Labs: 12/30/2023: ALT 46; BUN 13; Creatinine, Ser 1.11; Hemoglobin 15.4; Platelets 250; Potassium 4.3; Sodium 141; TSH 1.090  Recent Lipid Panel    Component Value Date/Time   CHOL 198 12/30/2023 0912   CHOL 120 07/14/2015 1330   TRIG 102 12/30/2023 0912   TRIG 54 07/14/2015 1330   HDL 46 12/30/2023 0912   CHOLHDL 4.3 12/30/2023 0912   VLDL 11 07/14/2015 1330   LDLCALC 134 (H) 12/30/2023 0912     Risk Assessment/Calculations:             Physical Exam:    VS:  BP 127/81 (BP Location: Left Arm, Patient Position: Sitting, Cuff Size: Large)   Pulse 73   Resp 16   Ht 6' (1.829 m)   Wt 223 lb 12.8 oz (101.5 kg)   SpO2 95%   BMI 30.35 kg/m     Wt Readings from Last 3 Encounters:  04/28/24 223 lb 12.8 oz (101.5 kg)  12/30/23 225 lb 3.2 oz (102.2 kg)  12/19/23 222 lb 12.8 oz (101.1 kg)     GEN:  Well nourished, well developed in no acute distress HEENT: Normal NECK: No JVD; No carotid bruits CARDIAC: RRR, no murmurs, rubs, gallops RESPIRATORY:  Clear to auscultation without rales, wheezing or rhonchi  ABDOMEN: Soft, non-tender, non-distended MUSCULOSKELETAL:  No edema; No deformity  SKIN: Warm and dry NEUROLOGIC:  Alert and oriented x 3 PSYCHIATRIC:  Normal affect   ASSESSMENT:    1. Dizziness   2. Weight gain due  to medication    PLAN:    In order of problems listed above:  Dizziness, during exercising only.  Denies any symptoms during normal activity.  Denies syncope.  Cardiac monitor was normal with no significant arrhythmias.  Rare PACs, PVCs.  Symptoms likely attributed to dehydration, may be hypoglycemia.  He is otherwise low cardiac risk.  Adequate hydration during exercise, preworkout bars to help with blood sugar advised and encouraged. Weight gain due to medication.  Recommend talking with psychiatrist regarding options and medication side effects.  Follow-up as needed      Medication Adjustments/Labs and Tests Ordered: Current medicines are reviewed at length with the patient today.  Concerns regarding medicines  are outlined above.  Orders Placed This Encounter  Procedures   EKG 12-Lead   No orders of the defined types were placed in this encounter.   Patient Instructions  Medication Instructions:  Your Physician recommend you continue on your current medication as directed.    *If you need a refill on your cardiac medications before your next appointment, please call your pharmacy*  Lab Work: No labs ordered today  If you have labs (blood work) drawn today and your tests are completely normal, you will receive your results only by: MyChart Message (if you have MyChart) OR A paper copy in the mail If you have any lab test that is abnormal or we need to change your treatment, we will call you to review the results.  Testing/Procedures: No test ordered today   Follow-Up: At [facility], you and your health needs are our priority.  As part of our continuing mission to provide you with exceptional heart care, our providers are all part of one team.  This team includes your primary Cardiologist (physician) and Advanced Practice Providers or APPs (Physician Assistants and Nurse Practitioners) who all work together to provide you with the care you need, when you need  it.  Your next appointment:   Follow up as needed.   We recommend signing up for the patient portal called "MyChart".  Sign up information is provided on this After Visit Summary.  MyChart is used to connect with patients for Virtual Visits (Telemedicine).  Patients are able to view lab/test results, encounter notes, upcoming appointments, etc.  Non-urgent messages can be sent to your provider as well.   To learn more about what you can do with MyChart, go to [email or web address].         Signed, Constancia Delton, MD  04/28/2024 1:13 PM    [facility]

## 2024-04-28 NOTE — Patient Instructions (Signed)
 Medication Instructions:  Your Physician recommend you continue on your current medication as directed.    *If you need a refill on your cardiac medications before your next appointment, please call your pharmacy*  Lab Work: No labs ordered today  If you have labs (blood work) drawn today and your tests are completely normal, you will receive your results only by: MyChart Message (if you have MyChart) OR A paper copy in the mail If you have any lab test that is abnormal or we need to change your treatment, we will call you to review the results.  Testing/Procedures: No test ordered today   Follow-Up: At [facility], you and your health needs are our priority.  As part of our continuing mission to provide you with exceptional heart care, our providers are all part of one team.  This team includes your primary Cardiologist (physician) and Advanced Practice Providers or APPs (Physician Assistants and Nurse Practitioners) who all work together to provide you with the care you need, when you need it.  Your next appointment:   Follow up as needed.   We recommend signing up for the patient portal called "MyChart".  Sign up information is provided on this After Visit Summary.  MyChart is used to connect with patients for Virtual Visits (Telemedicine).  Patients are able to view lab/test results, encounter notes, upcoming appointments, etc.  Non-urgent messages can be sent to your provider as well.   To learn more about what you can do with MyChart, go to [email or web address].

## 2024-05-21 ENCOUNTER — Encounter: Payer: Self-pay | Admitting: Nurse Practitioner

## 2024-05-25 NOTE — Telephone Encounter (Signed)
 Appt scheduled

## 2024-06-04 ENCOUNTER — Ambulatory Visit: Admitting: Pediatrics

## 2024-06-08 ENCOUNTER — Ambulatory Visit: Admitting: Pediatrics

## 2024-06-08 ENCOUNTER — Encounter: Payer: Self-pay | Admitting: Pediatrics

## 2024-06-08 VITALS — BP 126/76 | HR 69 | Temp 97.4°F | Wt 220.2 lb

## 2024-06-08 DIAGNOSIS — R5383 Other fatigue: Secondary | ICD-10-CM | POA: Diagnosis not present

## 2024-06-08 DIAGNOSIS — F649 Gender identity disorder, unspecified: Secondary | ICD-10-CM

## 2024-06-08 DIAGNOSIS — F3181 Bipolar II disorder: Secondary | ICD-10-CM | POA: Diagnosis not present

## 2024-06-08 MED ORDER — SAFETY SYRINGE/NEEDLE 23G X 1" 3 ML MISC: 0 refills | Status: AC

## 2024-06-08 MED ORDER — ESTRADIOL CYPIONATE 5 MG/ML IM OIL: 5.0000 mg | TOPICAL_OIL | INTRAMUSCULAR | 3 refills | Status: DC

## 2024-06-08 MED ORDER — SYRINGE 18G X 1-1/2" 3 ML MISC: 6 refills | Status: AC

## 2024-06-08 MED ORDER — SPIRONOLACTONE 50 MG PO TABS: 50.0000 mg | ORAL_TABLET | Freq: Every day | ORAL | 1 refills | Status: DC

## 2024-06-08 NOTE — Progress Notes (Signed)
 Office Visit  BP 126/76   Pulse 69   Temp (!) 97.4 F (36.3 C) (Oral)   Wt 220 lb 3.2 oz (99.9 kg)   SpO2 97%   BMI 29.86 kg/m    Subjective:    Patient ID: Cole Riley, adult    DOB: [birth year], 28 y.o.   MRN: [id number]  HPI: Cole Riley is a 28 y.o. adult  Chief Complaint  Patient presents with   Gender affirming care    Discussed the use of AI scribe software for clinical note transcription with the patient, who gave verbal consent to proceed.  History of Present Illness   Cole Riley is a 28 year old person who presents with gender dysphoria.  They have been experiencing discomfort with their gender identity for about a year. Initially, they felt uncomfortable in their body without understanding why, but over time, they began exploring their identity more deeply. They have been engaging with content from transgender individuals on social media and discussing their feelings with a therapist for the past three to four months. They describe themselves as transitioning into a non-binary space, feeling more comfortable with a feminine expression, although they are not yet fully comfortable expressing this in all environments due to concerns about others' perceptions.  They experience a general sense of discomfort and disconnect with their body, similar to dysphoria, although they have not received a formal diagnosis. This feeling is not localized to specific body parts, but they note particular discomfort with facial and body hair, which they manage by shaving. They feel more congruent with their identity when they shave.  They have a history of bipolar disorder, managed with Zoloft and Vraylar, which have been stable and effective. This stability has allowed them to move beyond survival mode and begin exploring their gender identity more fully. Previously, they had not considered their identity deeply due to focusing on managing their bipolar symptoms.  Socially,  they describe a mixed environment. They live near their parents, who are homophobic and transphobic, and have coworkers who have made transphobic comments, limiting their comfort in expressing their identity. However, they have a supportive partner and friends who are part of the queer community, although they have not discussed their gender identity extensively with them yet. They find comfort in spaces like Pride events, where they can express themselves more freely.  They have been researching feminizing hormone therapy as part of their exploration of gender identity. They have considered the implications of such treatments, including potential changes and their reversibility, and have discussed these with their partner. They are not concerned about fertility preservation as they and their partner do not plan to have children.  They have also considered the impact of their voice on their gender incongruence, noting that their deep voice feels particularly masculine and contributes to their discomfort. They have not yet pursued speech therapy but acknowledge it as a potential area for future exploration.      Relevant past medical, surgical, family and social history reviewed and updated as indicated. Interim medical history since our last visit reviewed. Allergies and medications reviewed and updated.  ROS per HPI unless specifically indicated above     Objective:    BP 126/76   Pulse 69   Temp (!) 97.4 F (36.3 C) (Oral)   Wt 220 lb 3.2 oz (99.9 kg)   SpO2 97%   BMI 29.86 kg/m   Wt Readings from Last 3 Encounters:  06/08/24 220 lb 3.2  oz (99.9 kg)  04/28/24 223 lb 12.8 oz (101.5 kg)  12/30/23 225 lb 3.2 oz (102.2 kg)     Physical Exam Constitutional:      Appearance: Normal appearance.  Pulmonary:     Effort: Pulmonary effort is normal.   Musculoskeletal:        General: Normal range of motion.   Skin:    Comments: Normal skin color   Neurological:     General: No focal  deficit present.     Mental Status: Cole Riley is alert. Mental status is at baseline.   Psychiatric:        Mood and Affect: Mood normal.        Behavior: Behavior normal.        Thought Content: Thought content normal.         06/08/2024    8:17 AM 12/30/2023    8:48 AM 12/19/2023   10:29 AM 11/19/2023    4:08 PM 04/10/2023    4:28 PM  Depression screen PHQ 2/9  Decreased Interest 1 2 2 2 2   Down, Depressed, Hopeless 1 2 2 2 2   PHQ - 2 Score 2 4 4 4 4   Altered sleeping 2 1 3 2 2   Tired, decreased energy 2 1 1 2 3   Change in appetite 1 2 2 1  0  Feeling bad or failure about yourself  1 2 2 1 1   Trouble concentrating 0 0 0 0 0  Moving slowly or fidgety/restless 0 0 0 0 0  Suicidal thoughts 0 1 1 2  0  PHQ-9 Score 8 11 13 12 10   Difficult doing work/chores Not difficult at all  Somewhat difficult  Somewhat difficult       06/08/2024    8:17 AM 12/30/2023    8:48 AM 12/19/2023   10:29 AM 11/19/2023    4:08 PM  GAD 7 : Generalized Anxiety Score  Nervous, Anxious, on Edge 1 2 1 2   Control/stop worrying 1 1 1 2   Worry too much - different things 1 1 1 1   Trouble relaxing 0 1 1 0  Restless 1 0 0 1  Easily annoyed or irritable 2 1 2 2   Afraid - awful might happen 1 2 2 1   Total GAD 7 Score 7 8 8 9   Anxiety Difficulty Not difficult at all          Assessment & Plan:  Assessment & Plan   Gender dysphoria Assessment & Plan: Gender incongruence for at least 6 months though can identify discrepency even beyond. hey identify as non-binary, prefer feminine clothing, and are considering feminizing hormone therapy. Informed about effects and not interested in fertility preservation. Goals include softer skin, fat redistribution, and reduced body hair. Interested in spironolactone , estrogen injections, and speech therapy. Verbal consent to proceed. - Initiate spironolactone  for androgen blockade. - Prescribe estrogen injections for feminizing hormone therapy. - Provide educational  handout on feminizing hormone therapy. - Refer to speech therapy for voice incongruence.  Orders: -     Hemoglobin A1c -     Comprehensive metabolic panel with GFR -     Lipid panel -     CBC -     Testosterone  -     Estradiol  -     Spironolactone ; Take 1 tablet (50 mg total) by mouth daily.  Dispense: 90 tablet; Refill: 1 -     Safety Syringe/Needle; Use 23G needle to inject  Dispense: 100 each; Refill: 0 -  Syringe; Use these needles to draw up medication  Dispense: 100 each; Refill: 6 -     Estradiol  Cypionate; Inject 1 mL (5 mg total) into the muscle every 14 (fourteen) days.  Dispense: 5 mL; Refill: 3 -     Ambulatory referral to Speech Therapy  Other fatigue Routine lab work due as requested by dietitian. - Order lab tests: vitamin D , A1c, B12, and fasting glucose. -     VITAMIN D  25 Hydroxy (Vit-D Deficiency, Fractures) -     Vitamin B12  Bipolar 2 disorder (HCC) Assessment & Plan: Well-managed with Zoloft and Vraylar, aiding focus on gender identity. - Continue Zoloft and Vraylar.  Gender Affirming Care Checklist [x ] Collect gender history; make diagnosis of gender dysphoria1 [x ] Reviewed patient goals: interest in nonbinary presentation, feminization [x ] Interest in surgery for gender affirmation? Not at this time  [x ] Fertility goals? Definitively do not want biologic children   [x ] Contraceptive needs?  no [x ] Interest in hair removal, speech therapy? [x ] Reviewed health conditions which might be influenced by GAHT: e.g. smoking (VTE risk for AMAB), DM, HTN, HLD, CAD, polycythemia, OSA [x ] Review meds for interactions [x ] Assess mental health needs and refer if appropriate [x ] Obtain baseline labs  [x ] Assess capacity for consent, and start informed consent process: [x ] Review risks and benefits, expected effects of GAHT [x ] Verbal Consent obtained  Follow up plan: Return in about 6 weeks (around 07/20/2024) for gender affirming care- they/them  pronouns.  Hadassah SHAUNNA Nett, MD

## 2024-06-08 NOTE — Patient Instructions (Signed)
 Plan on start: Spironolactone 50mg  daily   Estrogen options: - pill - patch - Injection either subcutaneously or in the muscle ----Estradiol: injectable either once weekly or once every 2 weeks depending on insurance  Return in 3 months

## 2024-06-09 LAB — COMPREHENSIVE METABOLIC PANEL WITH GFR
ALT: 47 IU/L — ABNORMAL HIGH (ref 0–44)
AST: 31 IU/L (ref 0–40)
Albumin: 4.9 g/dL (ref 4.3–5.2)
Alkaline Phosphatase: 107 IU/L (ref 44–121)
BUN/Creatinine Ratio: 10 (ref 9–20)
BUN: 11 mg/dL (ref 6–20)
Bilirubin Total: 0.3 mg/dL (ref 0.0–1.2)
CO2: 22 mmol/L (ref 20–29)
Calcium: 9.9 mg/dL (ref 8.7–10.2)
Chloride: 103 mmol/L (ref 96–106)
Creatinine, Ser: 1.11 mg/dL (ref 0.76–1.27)
Globulin, Total: 2.3 g/dL (ref 1.5–4.5)
Glucose: 95 mg/dL (ref 70–99)
Potassium: 4.3 mmol/L (ref 3.5–5.2)
Sodium: 142 mmol/L (ref 134–144)
Total Protein: 7.2 g/dL (ref 6.0–8.5)
eGFR: 93 mL/min/{1.73_m2} (ref >59)

## 2024-06-09 LAB — LIPID PANEL
Chol/HDL Ratio: 5.5 ratio — ABNORMAL HIGH (ref 0.0–5.0)
Cholesterol, Total: 204 mg/dL — ABNORMAL HIGH (ref 100–199)
HDL: 37 mg/dL — ABNORMAL LOW (ref >39)
LDL Chol Calc (NIH): 147 mg/dL — ABNORMAL HIGH (ref 0–99)
Triglycerides: 108 mg/dL (ref 0–149)
VLDL Cholesterol Cal: 20 mg/dL (ref 5–40)

## 2024-06-09 LAB — TESTOSTERONE: Testosterone: 346 ng/dL (ref 264–916)

## 2024-06-09 LAB — HEMOGLOBIN A1C
Est. average glucose Bld gHb Est-mCnc: 103 mg/dL
Hgb A1c MFr Bld: 5.2 % (ref 4.8–5.6)

## 2024-06-09 LAB — ESTRADIOL: Estradiol: 15.5 pg/mL (ref 7.6–42.6)

## 2024-06-09 LAB — CBC
Hematocrit: 49.2 % (ref 37.5–51.0)
Hemoglobin: 16.3 g/dL (ref 13.0–17.7)
MCH: 30.2 pg (ref 26.6–33.0)
MCHC: 33.1 g/dL (ref 31.5–35.7)
MCV: 91 fL (ref 79–97)
Platelets: 243 10*3/uL (ref 150–450)
RBC: 5.39 x10E6/uL (ref 4.14–5.80)
RDW: 12.8 % (ref 11.6–15.4)
WBC: 5.4 10*3/uL (ref 3.4–10.8)

## 2024-06-09 LAB — VITAMIN D 25 HYDROXY (VIT D DEFICIENCY, FRACTURES): Vit D, 25-Hydroxy: 35.7 ng/mL (ref 30.0–100.0)

## 2024-06-09 LAB — VITAMIN B12: Vitamin B-12: 947 pg/mL (ref 232–1245)

## 2024-06-15 ENCOUNTER — Ambulatory Visit: Payer: Self-pay | Admitting: Pediatrics

## 2024-06-22 ENCOUNTER — Encounter: Payer: Self-pay | Admitting: Pediatrics

## 2024-06-22 DIAGNOSIS — F649 Gender identity disorder, unspecified: Secondary | ICD-10-CM | POA: Insufficient documentation

## 2024-06-22 NOTE — Assessment & Plan Note (Signed)
 Well-managed with Zoloft and Vraylar, aiding focus on gender identity. - Continue Zoloft and Vraylar.

## 2024-06-22 NOTE — Assessment & Plan Note (Signed)
 Gender incongruence for at least 6 months though can identify discrepency even beyond. hey identify as non-binary, prefer feminine clothing, and are considering feminizing hormone therapy. Informed about effects and not interested in fertility preservation. Goals include softer skin, fat redistribution, and reduced body hair. Interested in spironolactone , estrogen injections, and speech therapy. Verbal consent to proceed. - Initiate spironolactone  for androgen blockade. - Prescribe estrogen injections for feminizing hormone therapy. - Provide educational handout on feminizing hormone therapy. - Refer to speech therapy for voice incongruence.

## 2024-07-22 ENCOUNTER — Ambulatory Visit: Admitting: Nurse Practitioner

## 2024-08-13 ENCOUNTER — Ambulatory Visit: Admitting: Pediatrics

## 2024-08-13 VITALS — BP 112/70 | HR 82 | Temp 97.8°F | Wt 219.6 lb

## 2024-08-13 DIAGNOSIS — F649 Gender identity disorder, unspecified: Secondary | ICD-10-CM | POA: Diagnosis not present

## 2024-08-13 NOTE — Progress Notes (Signed)
 Office Visit  BP 112/70   Pulse 82   Temp 97.8 F (36.6 C) (Oral)   Wt 219 lb 9.6 oz (99.6 kg)   SpO2 96%   BMI 29.78 kg/m    Subjective:    Patient ID: Cole Riley, adult    DOB: 07/20/1996, 28 y.o.   MRN: [id number]  HPI: MEREK NIU is a 28 y.o. adult  Chief Complaint  Patient presents with   Follow-up    Discussed the use of AI scribe software for clinical note transcription with the patient, who gave verbal consent to proceed.  History of Present Illness   Davia Tavella is a 28 year old who presents for follow-up on hormone therapy.  They are currently taking spironolactone  50 mg without significant changes in their medication regimen. They experience increased sensitivity and tenderness around the breast and nipple area, which is painful if bumped. They have noticed a decrease in the rate of facial hair growth, which they appreciate.  They manage their injections well, tracking them on their phone and rotating the injection site between legs. They have accessed syringes from a local pharmacy without issue.  They are interested in voice changes and had a referral to a voice therapy program but canceled the appointment due to insurance coverage issues.    Relevant past medical, surgical, family and social history reviewed and updated as indicated. Interim medical history since our last visit reviewed. Allergies and medications reviewed and updated.  ROS per HPI unless specifically indicated above     Objective:    BP 112/70   Pulse 82   Temp 97.8 F (36.6 C) (Oral)   Wt 219 lb 9.6 oz (99.6 kg)   SpO2 96%   BMI 29.78 kg/m   Wt Readings from Last 3 Encounters:  08/13/24 219 lb 9.6 oz (99.6 kg)  06/08/24 220 lb 3.2 oz (99.9 kg)  04/28/24 223 lb 12.8 oz (101.5 kg)     Physical Exam Constitutional:      Appearance: Normal appearance.  Pulmonary:     Effort: Pulmonary effort is normal.  Musculoskeletal:        General: Normal range of motion.   Skin:    Comments: Normal skin color  Neurological:     General: No focal deficit present.     Mental Status: Rai is alert. Mental status is at baseline.  Psychiatric:        Mood and Affect: Mood normal.        Behavior: Behavior normal.        Thought Content: Thought content normal.         08/13/2024    8:56 AM 06/08/2024    8:17 AM 12/30/2023    8:48 AM 12/19/2023   10:29 AM 11/19/2023    4:08 PM  Depression screen PHQ 2/9  Decreased Interest 1 1 2 2 2   Down, Depressed, Hopeless 1 1 2 2 2   PHQ - 2 Score 2 2 4 4 4   Altered sleeping 1 2 1 3 2   Tired, decreased energy 0 2 1 1 2   Change in appetite 2 1 2 2 1   Feeling bad or failure about yourself  1 1 2 2 1   Trouble concentrating 0 0 0 0 0  Moving slowly or fidgety/restless 0 0 0 0 0  Suicidal thoughts 0 0 1 1 2   PHQ-9 Score 6 8 11 13 12   Difficult doing work/chores Not difficult at all Not difficult at all  Somewhat difficult        08/13/2024    8:56 AM 06/08/2024    8:17 AM 12/30/2023    8:48 AM 12/19/2023   10:29 AM  GAD 7 : Generalized Anxiety Score  Nervous, Anxious, on Edge 1 1 2 1   Control/stop worrying 1 1 1 1   Worry too much - different things 1 1 1 1   Trouble relaxing 0 0 1 1  Restless 1 1 0 0  Easily annoyed or irritable 2 2 1 2   Afraid - awful might happen 0 1 2 2   Total GAD 7 Score 6 7 8 8   Anxiety Difficulty Not difficult at all Not difficult at all         Assessment & Plan:  Assessment & Plan   Gender dysphoria Assessment & Plan: Ongoing therapy with spironolactone  50 mg. Effects include breast growth/tenderness and reduced facial hair. Satisfied with current regimen. Monitoring electrolytes and cholesterol planned. - Referral to voice therapy at [facility] canceled due to insurance issues. Will reach out to them directly - Continue spironolactone  50 mg and estrodiol IM 5mg  q14 - Order blood tests for electrolytes and cholesterol. - Provide additional syringes for hormone injections. - Monitor  for bothersome side effects, particularly breast tenderness.  Orders: -     Basic metabolic panel with GFR -     Lipid panel     Follow up plan: No follow-ups on file.  Hadassah SHAUNNA Nett, MD

## 2024-08-14 ENCOUNTER — Encounter: Payer: Self-pay | Admitting: Pediatrics

## 2024-08-14 LAB — LIPID PANEL
Chol/HDL Ratio: 4.1 ratio (ref 0.0–5.0)
Cholesterol, Total: 172 mg/dL (ref 100–199)
HDL: 42 mg/dL (ref >39)
LDL Chol Calc (NIH): 111 mg/dL — ABNORMAL HIGH (ref 0–99)
Triglycerides: 102 mg/dL (ref 0–149)
VLDL Cholesterol Cal: 19 mg/dL (ref 5–40)

## 2024-08-14 LAB — BASIC METABOLIC PANEL WITH GFR
BUN/Creatinine Ratio: 14 (ref 9–20)
BUN: 12 mg/dL (ref 6–20)
CO2: 21 mmol/L (ref 20–29)
Calcium: 9.4 mg/dL (ref 8.7–10.2)
Chloride: 103 mmol/L (ref 96–106)
Creatinine, Ser: 0.88 mg/dL (ref 0.76–1.27)
Glucose: 84 mg/dL (ref 70–99)
Potassium: 4.2 mmol/L (ref 3.5–5.2)
Sodium: 140 mmol/L (ref 134–144)
eGFR: 121 mL/min/1.73 (ref >59)

## 2024-08-14 NOTE — Assessment & Plan Note (Addendum)
 Ongoing therapy with spironolactone  50 mg. Effects include breast growth/tenderness and reduced facial hair. Satisfied with current regimen. Monitoring electrolytes and cholesterol planned. - Referral to voice therapy at [facility] canceled due to insurance issues. Will reach out to them directly - Continue spironolactone  50 mg and estrodiol IM 5mg  q14 - Order blood tests for electrolytes and cholesterol. - Provide additional syringes for hormone injections. - Monitor for bothersome side effects, particularly breast tenderness.

## 2024-08-19 ENCOUNTER — Ambulatory Visit: Payer: Self-pay | Admitting: Pediatrics

## 2024-08-19 ENCOUNTER — Other Ambulatory Visit: Payer: Self-pay | Admitting: Pediatrics

## 2024-08-19 DIAGNOSIS — F649 Gender identity disorder, unspecified: Secondary | ICD-10-CM

## 2024-08-19 MED ORDER — ESTRADIOL CYPIONATE 5 MG/ML IM OIL: 5.0000 mg | TOPICAL_OIL | INTRAMUSCULAR | 3 refills | Status: DC

## 2024-08-19 NOTE — Progress Notes (Signed)
 Sent estrogen to [place] address.  Cole SHAUNNA Nett, MD

## 2024-08-26 ENCOUNTER — Other Ambulatory Visit: Payer: Self-pay | Admitting: Pediatrics

## 2024-08-26 DIAGNOSIS — F649 Gender identity disorder, unspecified: Secondary | ICD-10-CM

## 2024-08-26 MED ORDER — ESTRADIOL CYPIONATE 5 MG/ML IM OIL: 5.0000 mg | TOPICAL_OIL | INTRAMUSCULAR | 3 refills | Status: DC

## 2024-08-26 NOTE — Progress Notes (Signed)
 [facility]: [facility] [address] [place], [place], [place] [address]

## 2024-10-05 ENCOUNTER — Telehealth (INDEPENDENT_AMBULATORY_CARE_PROVIDER_SITE_OTHER): Admitting: Pediatrics

## 2024-10-05 ENCOUNTER — Encounter: Payer: Self-pay | Admitting: Pediatrics

## 2024-10-05 DIAGNOSIS — K219 Gastro-esophageal reflux disease without esophagitis: Secondary | ICD-10-CM | POA: Diagnosis not present

## 2024-10-05 DIAGNOSIS — N644 Mastodynia: Secondary | ICD-10-CM | POA: Diagnosis not present

## 2024-10-05 DIAGNOSIS — F649 Gender identity disorder, unspecified: Secondary | ICD-10-CM | POA: Diagnosis not present

## 2024-10-05 MED ORDER — OMEPRAZOLE 20 MG PO CPDR: 20.0000 mg | DELAYED_RELEASE_CAPSULE | Freq: Every day | ORAL | 3 refills | Status: DC

## 2024-10-05 NOTE — Patient Instructions (Signed)
 Take half of current dose today. If still having symptoms, plan to do another half dose again for your next dose.  If symptoms are ok the next 2 weeks, you can stay on the half dose.

## 2024-10-05 NOTE — Progress Notes (Signed)
 Telehealth Visit  I connected with  Cole Riley on 10/05/24 by a video enabled telemedicine application and verified that I am speaking with the correct person using two identifiers.   I discussed the limitations of evaluation and management by telemedicine. The patient expressed understanding and agreed to proceed.  Subjective:    Patient ID: Cole Riley, adult    DOB: 1996-02-11, 28 y.o.   MRN: [id number]  HPI: Cole Riley is a 27 y.o. adult  Chief Complaint  Patient presents with   Follow-up    Having a few side affects from the medication for the most part everything has been going up and down     Discussed the use of AI scribe software for clinical note transcription with the patient, who gave verbal consent to proceed.  History of Present Illness   Cole Riley is a 28 year old who presents with nausea and acid reflux symptoms.  They have been experiencing significant nausea over the past week, associated with acid reflux and frequent gagging. The gagging occurs with activities such as drinking water, cleaning the litter box, and eating food. The symptoms appeared suddenly and have been bothersome.  There is a possible correlation between the onset of symptoms and a disruption in their medication schedule due to travel, which may have affected their dosing routine.  They are taking spironolactone  at a dose of 50 mg and have been experiencing chest tenderness that is 'almost painful to the touch'. They have not been taking any medication for acid reflux. In terms of their medication routine, they are injecting 1 mL of their prescribed medication. They have not been using any anti-nausea medication.     Relevant past medical, surgical, family and social history reviewed and updated as indicated. Interim medical history since our last visit reviewed. Allergies and medications reviewed and updated.  ROS per HPI unless specifically indicated above      Objective:    There were no vitals taken for this visit.  Wt Readings from Last 3 Encounters:  08/13/24 219 lb 9.6 oz (99.6 kg)  06/08/24 220 lb 3.2 oz (99.9 kg)  04/28/24 223 lb 12.8 oz (101.5 kg)     Physical Exam Constitutional:      General: Cole Riley is not in acute distress.    Appearance: Normal appearance.  Neurological:     General: No focal deficit present.     Mental Status: Cole Riley is alert. Mental status is at baseline.      LIMITED EXAM GIVEN VIDEO VISIT     Assessment & Plan:  Assessment & Plan   Gender dysphoria Assessment & Plan: Symptoms have subsided, likely due to dosing gap. Nausea is a common side effect of estrogen therapy. - Administer 0.5 mL estrogen injection today and monitor symptoms. - Prescribe omeprazole  once daily, 30 minutes before the largest meal. - Monitor symptoms and adjust estrogen dose as needed. - Consider alternative estrogen replacement if symptoms persist.  GERD See above, sent omeprazole .  Breast tenderness Chest tenderness potentially related to spironolactone  50 mg dose. - Hold spironolactone  for two weeks. - Monitor chest tenderness and reassess in two weeks. - Consider reducing spironolactone  dose to 25 mg if symptoms persist.   Follow up plan: Already scheduled  Cole SHAUNNA Nett, MD   This visit was completed via video visit through MyChart due to the restrictions of the COVID-19 pandemic. All issues as above were discussed and addressed. Physical exam was done as above  through visual confirmation on video through MyChart. If it was felt that the patient should be evaluated in the office, they were directed there. The patient verbally consented to this visit.  Location of the patient: home Location of the provider: work Those involved with this call:  Provider: Hadassah Nett, MD CMA: Cena Maffucci, CMA Time spent on call: 15 minutes with patient face to face via video conference. More than 50% of this time was spent in  counseling and coordination of care. 15 minutes total spent in review of patient's record and preparation of their chart. Total time spent on this encounter: 30 minutes.

## 2024-10-05 NOTE — Assessment & Plan Note (Signed)
 Symptoms have subsided, likely due to dosing gap. Nausea is a common side effect of estrogen therapy. - Administer 0.5 mL estrogen injection today and monitor symptoms. - Prescribe omeprazole  once daily, 30 minutes before the largest meal. - Monitor symptoms and adjust estrogen dose as needed. - Consider alternative estrogen replacement if symptoms persist.

## 2024-11-09 ENCOUNTER — Other Ambulatory Visit: Payer: Self-pay | Admitting: Pediatrics

## 2024-11-09 DIAGNOSIS — F649 Gender identity disorder, unspecified: Secondary | ICD-10-CM

## 2024-11-09 MED ORDER — ESTRADIOL VALERATE 20 MG/ML IM OIL
4.0000 mg | TOPICAL_OIL | INTRAMUSCULAR | 1 refills | Status: AC
Start: 2024-11-09 — End: ?

## 2024-11-09 NOTE — Progress Notes (Signed)
 Switching estrogen formulation to weekly dosing.  Cole SHAUNNA Nett, MD

## 2024-11-18 ENCOUNTER — Ambulatory Visit: Admitting: Nurse Practitioner

## 2024-11-18 ENCOUNTER — Encounter: Payer: Self-pay | Admitting: Pediatrics

## 2024-11-18 ENCOUNTER — Ambulatory Visit: Admitting: Pediatrics

## 2024-11-18 VITALS — BP 95/61 | HR 86 | Temp 98.0°F | Ht 72.0 in | Wt 226.0 lb

## 2024-11-18 DIAGNOSIS — I959 Hypotension, unspecified: Secondary | ICD-10-CM | POA: Diagnosis not present

## 2024-11-18 DIAGNOSIS — M542 Cervicalgia: Secondary | ICD-10-CM | POA: Diagnosis not present

## 2024-11-18 DIAGNOSIS — F649 Gender identity disorder, unspecified: Secondary | ICD-10-CM

## 2024-11-18 MED ORDER — SPIRONOLACTONE 25 MG PO TABS: 25.0000 mg | ORAL_TABLET | Freq: Every day | ORAL | 3 refills | Status: DC

## 2024-11-18 MED ORDER — ONDANSETRON 4 MG PO TBDP: 4.0000 mg | ORAL_TABLET | Freq: Three times a day (TID) | ORAL | 0 refills | Status: AC | PRN

## 2024-11-18 NOTE — Patient Instructions (Signed)
 Will plan to make dose adjustments based on lab levels
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
## 2024-11-19 ENCOUNTER — Other Ambulatory Visit

## 2024-11-19 DIAGNOSIS — F649 Gender identity disorder, unspecified: Secondary | ICD-10-CM

## 2024-11-21 LAB — COMPREHENSIVE METABOLIC PANEL WITH GFR
ALT: 40 IU/L (ref 0–44)
AST: 23 IU/L (ref 0–40)
Albumin: 4.8 g/dL (ref 4.3–5.2)
Alkaline Phosphatase: 87 IU/L (ref 47–123)
BUN/Creatinine Ratio: 15 (ref 9–20)
BUN: 12 mg/dL (ref 6–20)
Bilirubin Total: 0.4 mg/dL (ref 0.0–1.2)
CO2: 24 mmol/L (ref 20–29)
Calcium: 9.8 mg/dL (ref 8.7–10.2)
Chloride: 103 mmol/L (ref 96–106)
Creatinine, Ser: 0.81 mg/dL (ref 0.76–1.27)
Globulin, Total: 2.2 g/dL (ref 1.5–4.5)
Glucose: 87 mg/dL (ref 70–99)
Potassium: 4.3 mmol/L (ref 3.5–5.2)
Sodium: 142 mmol/L (ref 134–144)
Total Protein: 7 g/dL (ref 6.0–8.5)
eGFR: 124 mL/min/1.73 (ref >59)

## 2024-11-21 LAB — LIPID PANEL
Chol/HDL Ratio: 4 ratio (ref 0.0–5.0)
Cholesterol, Total: 189 mg/dL (ref 100–199)
HDL: 47 mg/dL (ref >39)
LDL Chol Calc (NIH): 117 mg/dL — ABNORMAL HIGH (ref 0–99)
Triglycerides: 138 mg/dL (ref 0–149)
VLDL Cholesterol Cal: 25 mg/dL (ref 5–40)

## 2024-11-21 LAB — TESTOSTERONE, FREE, TOTAL, SHBG
Sex Hormone Binding: 30.2 nmol/L (ref 16.5–55.9)
Testosterone, Free: 4.4 pg/mL — AB (ref 9.3–26.5)
Testosterone: 30 ng/dL — ABNORMAL LOW (ref 264–916)

## 2024-11-21 LAB — ESTRADIOL: Estradiol: 151 pg/mL — ABNORMAL HIGH (ref 7.6–42.6)

## 2024-11-30 ENCOUNTER — Ambulatory Visit: Payer: Self-pay | Admitting: Pediatrics

## 2024-12-16 ENCOUNTER — Ambulatory Visit: Admitting: Pediatrics

## 2024-12-16 ENCOUNTER — Encounter: Payer: Self-pay | Admitting: Pediatrics

## 2024-12-16 VITALS — BP 123/73 | HR 103 | Temp 98.3°F | Ht 72.0 in | Wt 226.6 lb

## 2024-12-16 DIAGNOSIS — F649 Gender identity disorder, unspecified: Secondary | ICD-10-CM

## 2024-12-16 DIAGNOSIS — F3181 Bipolar II disorder: Secondary | ICD-10-CM | POA: Diagnosis not present

## 2024-12-16 DIAGNOSIS — K649 Unspecified hemorrhoids: Secondary | ICD-10-CM

## 2024-12-16 MED ORDER — HYDROCORTISONE (PERIANAL) 2.5 % EX CREA: 1.0000 | TOPICAL_CREAM | Freq: Two times a day (BID) | CUTANEOUS | 0 refills | Status: AC

## 2024-12-16 MED ORDER — ESTRADIOL VALERATE 20 MG/ML IM OIL: 5.0000 mg | TOPICAL_OIL | INTRAMUSCULAR | Status: AC

## 2024-12-16 MED ORDER — SPIRONOLACTONE 25 MG PO TABS: 25.0000 mg | ORAL_TABLET | Freq: Every day | ORAL | 3 refills | Status: AC

## 2024-12-16 NOTE — Patient Instructions (Signed)
 Sitz baths  Increase fiber, hydration

## 2024-12-16 NOTE — Progress Notes (Unsigned)
 Office Visit  BP 123/73   Pulse (!) 103   Temp 98.3 F (36.8 C) (Oral)   Ht 6' (1.829 m)   Wt 226 lb 9.6 oz (102.8 kg)   SpO2 95%   BMI 30.73 kg/m    Subjective:    Patient ID: [name], adult    DOB: 1996-11-11, 28 y.o.   MRN: [id number]  HPI: CARDELL RACHEL is a 28 y.o. adult  Chief Complaint  Patient presents with   Follow-up    Discussed the use of AI scribe software for clinical note transcription with the patient, who gave verbal consent to proceed.  History of Present Illness   Wt Readings from Last 3 Encounters:  12/16/24 226 lb 9.6 oz (102.8 kg)  11/18/24 226 lb (102.5 kg)  08/13/24 219 lb 9.6 oz (99.6 kg)     Relevant past medical, surgical, family and social history reviewed and updated as indicated. Interim medical history since our last visit reviewed. Allergies and medications reviewed and updated.  ROS per HPI unless specifically indicated above     Objective:    BP 123/73   Pulse (!) 103   Temp 98.3 F (36.8 C) (Oral)   Ht 6' (1.829 m)   Wt 226 lb 9.6 oz (102.8 kg)   SpO2 95%   BMI 30.73 kg/m   Wt Readings from Last 3 Encounters:  12/16/24 226 lb 9.6 oz (102.8 kg)  11/18/24 226 lb (102.5 kg)  08/13/24 219 lb 9.6 oz (99.6 kg)     Physical Exam      12/16/2024    9:33 AM 11/18/2024    8:49 AM 08/13/2024    8:56 AM 06/08/2024    8:17 AM 12/30/2023    8:48 AM  Depression screen PHQ 2/9  Decreased Interest 1 1 1 1 2   Down, Depressed, Hopeless 1 1 1 1 2   PHQ - 2 Score 2 2 2 2 4   Altered sleeping 2 2 1 2 1   Tired, decreased energy 1 2 0 2 1  Change in appetite 2 1 2 1 2   Feeling bad or failure about yourself  0 0 1 1 2   Trouble concentrating 0 0 0 0 0  Moving slowly or fidgety/restless 0 0 0 0 0  Suicidal thoughts 0 0 0 0 1  PHQ-9 Score 7 7 6  8  11    Difficult doing work/chores Not difficult at all Not difficult at all Not difficult at all Not difficult at all      Data saved with a previous flowsheet row  definition       12/16/2024    9:34 AM 11/18/2024    8:49 AM 08/13/2024    8:56 AM 06/08/2024    8:17 AM  GAD 7 : Generalized Anxiety Score  Nervous, Anxious, on Edge 2 2 1 1   Control/stop worrying 1 1 1 1   Worry too much - different things 1 1 1 1   Trouble relaxing 1 0 0 0  Restless 1 0 1 1  Easily annoyed or irritable 2 1 2 2   Afraid - awful might happen 1 1 0 1  Total GAD 7 Score 9 6 6 7   Anxiety Difficulty Somewhat difficult Somewhat difficult Not difficult at all Not difficult at all       Assessment & Plan:  Assessment & Plan   There are no diagnoses linked to this encounter.   Assessment and Plan Assessment & Plan  Follow up plan: No follow-ups on file.  Hadassah SHAUNNA Nett, MD

## 2024-12-19 ENCOUNTER — Encounter: Payer: Self-pay | Admitting: Pediatrics

## 2024-12-19 DIAGNOSIS — K649 Unspecified hemorrhoids: Secondary | ICD-10-CM | POA: Insufficient documentation

## 2024-12-19 NOTE — Assessment & Plan Note (Addendum)
 Well-managed on estrogen therapy. Recent blood work at goal. Mild breast tenderness noted. Satisfied with current dose. - Plan to increase estrogen to 5 mg and then 6 mg depending on desired changes - Continue spironolactone  25mg . Breast sensitivity at higher doses.

## 2024-12-19 NOTE — Assessment & Plan Note (Signed)
 Bright red bleeding during bowel movements, likely external hemorrhoids. No pain or constipation. Improved with increased fiber. - Increase fiber and hydration intake. - Perform sitz baths with salt twice weekly. - Use topical steroid cream as needed, up to four times a week. - Monitor symptoms and report if no improvement in two weeks.

## 2024-12-19 NOTE — Assessment & Plan Note (Signed)
 On zoloft 100mg  and vraylar 3mg . Well controlled. Follows w psych.

## 2025-03-16 ENCOUNTER — Encounter
